# Patient Record
Sex: Female | Born: 1940 | Race: White | Hispanic: No | Marital: Married | Smoking: Former smoker
Health system: Southern US, Community
[De-identification: ages and names within clinical notes are randomized; demographics above are authoritative.]

## PROBLEM LIST (undated history)

## (undated) DIAGNOSIS — K649 Unspecified hemorrhoids: Secondary | ICD-10-CM

## (undated) DIAGNOSIS — I700 Atherosclerosis of aorta: Secondary | ICD-10-CM

## (undated) DIAGNOSIS — D649 Anemia, unspecified: Secondary | ICD-10-CM

## (undated) DIAGNOSIS — I2510 Atherosclerotic heart disease of native coronary artery without angina pectoris: Secondary | ICD-10-CM

## (undated) DIAGNOSIS — G43909 Migraine, unspecified, not intractable, without status migrainosus: Secondary | ICD-10-CM

## (undated) DIAGNOSIS — M1990 Unspecified osteoarthritis, unspecified site: Secondary | ICD-10-CM

## (undated) DIAGNOSIS — I10 Essential (primary) hypertension: Secondary | ICD-10-CM

## (undated) DIAGNOSIS — K219 Gastro-esophageal reflux disease without esophagitis: Secondary | ICD-10-CM

## (undated) DIAGNOSIS — R519 Headache, unspecified: Secondary | ICD-10-CM

## (undated) DIAGNOSIS — R51 Headache: Secondary | ICD-10-CM

## (undated) DIAGNOSIS — D369 Benign neoplasm, unspecified site: Secondary | ICD-10-CM

## (undated) DIAGNOSIS — C801 Malignant (primary) neoplasm, unspecified: Secondary | ICD-10-CM

## (undated) DIAGNOSIS — E039 Hypothyroidism, unspecified: Secondary | ICD-10-CM

## (undated) DIAGNOSIS — E785 Hyperlipidemia, unspecified: Secondary | ICD-10-CM

## (undated) DIAGNOSIS — I38 Endocarditis, valve unspecified: Secondary | ICD-10-CM

## (undated) DIAGNOSIS — R0600 Dyspnea, unspecified: Secondary | ICD-10-CM

## (undated) DIAGNOSIS — J40 Bronchitis, not specified as acute or chronic: Secondary | ICD-10-CM

## (undated) DIAGNOSIS — G5600 Carpal tunnel syndrome, unspecified upper limb: Secondary | ICD-10-CM

## (undated) DIAGNOSIS — C44310 Basal cell carcinoma of skin of unspecified parts of face: Secondary | ICD-10-CM

## (undated) DIAGNOSIS — K08109 Complete loss of teeth, unspecified cause, unspecified class: Secondary | ICD-10-CM

## (undated) DIAGNOSIS — K5790 Diverticulosis of intestine, part unspecified, without perforation or abscess without bleeding: Secondary | ICD-10-CM

## (undated) DIAGNOSIS — J4520 Mild intermittent asthma, uncomplicated: Secondary | ICD-10-CM

## (undated) DIAGNOSIS — M48062 Spinal stenosis, lumbar region with neurogenic claudication: Secondary | ICD-10-CM

## (undated) DIAGNOSIS — Z86010 Personal history of colonic polyps: Secondary | ICD-10-CM

## (undated) DIAGNOSIS — J45909 Unspecified asthma, uncomplicated: Secondary | ICD-10-CM

## (undated) DIAGNOSIS — J189 Pneumonia, unspecified organism: Secondary | ICD-10-CM

## (undated) DIAGNOSIS — R011 Cardiac murmur, unspecified: Secondary | ICD-10-CM

## (undated) HISTORY — PX: TRIGGER FINGER RELEASE: SHX641

## (undated) HISTORY — PX: JOINT REPLACEMENT: SHX530

## (undated) HISTORY — PX: ABDOMINAL HYSTERECTOMY: SHX81

## (undated) HISTORY — PX: CARDIAC CATHETERIZATION: SHX172

## (undated) HISTORY — PX: APPENDECTOMY: SHX54

## (undated) HISTORY — PX: OTHER SURGICAL HISTORY: SHX169

## (undated) HISTORY — PX: DILATION AND CURETTAGE OF UTERUS: SHX78

---
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
## 1984-01-02 HISTORY — PX: VAGINAL HYSTERECTOMY: SUR661

## 1999-03-14 ENCOUNTER — Encounter: Payer: Self-pay | Admitting: Emergency Medicine

## 1999-03-14 ENCOUNTER — Inpatient Hospital Stay (HOSPITAL_COMMUNITY): Admission: EM | Admit: 1999-03-14 | Discharge: 1999-03-17 | Payer: Self-pay | Admitting: Emergency Medicine

## 1999-11-24 HISTORY — PX: ESOPHAGOGASTRODUODENOSCOPY: SHX1529

## 2004-09-25 ENCOUNTER — Ambulatory Visit: Payer: Self-pay | Admitting: Internal Medicine

## 2005-06-21 ENCOUNTER — Ambulatory Visit: Payer: Self-pay | Admitting: Orthopedic Surgery

## 2005-10-02 ENCOUNTER — Ambulatory Visit: Payer: Self-pay | Admitting: Internal Medicine

## 2006-09-10 ENCOUNTER — Other Ambulatory Visit: Payer: Self-pay

## 2006-09-10 ENCOUNTER — Ambulatory Visit: Payer: Self-pay | Admitting: Orthopedic Surgery

## 2006-09-24 ENCOUNTER — Ambulatory Visit: Payer: Self-pay | Admitting: Orthopedic Surgery

## 2006-09-24 ENCOUNTER — Other Ambulatory Visit: Payer: Self-pay

## 2006-10-01 ENCOUNTER — Ambulatory Visit: Payer: Self-pay | Admitting: Orthopedic Surgery

## 2006-10-08 ENCOUNTER — Ambulatory Visit: Payer: Self-pay | Admitting: Internal Medicine

## 2006-10-09 ENCOUNTER — Ambulatory Visit: Payer: Self-pay | Admitting: Internal Medicine

## 2007-10-13 ENCOUNTER — Ambulatory Visit: Payer: Self-pay | Admitting: Internal Medicine

## 2008-01-29 ENCOUNTER — Emergency Department: Payer: Self-pay | Admitting: Emergency Medicine

## 2008-02-03 ENCOUNTER — Ambulatory Visit: Payer: Self-pay | Admitting: Specialist

## 2008-07-20 ENCOUNTER — Ambulatory Visit: Payer: Self-pay | Admitting: Specialist

## 2008-08-30 ENCOUNTER — Ambulatory Visit: Payer: Self-pay | Admitting: Specialist

## 2008-10-18 ENCOUNTER — Ambulatory Visit: Payer: Self-pay | Admitting: Internal Medicine

## 2009-01-01 HISTORY — PX: CHOLECYSTECTOMY: SHX55

## 2009-01-01 HISTORY — PX: HEMORRHOID BANDING: SHX5850

## 2009-02-14 ENCOUNTER — Ambulatory Visit: Payer: Self-pay | Admitting: Unknown Physician Specialty

## 2009-02-23 ENCOUNTER — Ambulatory Visit: Payer: Self-pay | Admitting: Unknown Physician Specialty

## 2009-02-23 HISTORY — PX: COLONOSCOPY: SHX174

## 2009-02-23 HISTORY — PX: ESOPHAGOGASTRODUODENOSCOPY: SHX1529

## 2009-03-08 ENCOUNTER — Ambulatory Visit: Payer: Self-pay | Admitting: Surgery

## 2009-03-15 ENCOUNTER — Ambulatory Visit: Payer: Self-pay | Admitting: Surgery

## 2009-07-20 ENCOUNTER — Ambulatory Visit: Payer: Self-pay | Admitting: Specialist

## 2009-08-10 ENCOUNTER — Inpatient Hospital Stay: Payer: Self-pay | Admitting: Specialist

## 2009-08-31 ENCOUNTER — Encounter: Payer: Self-pay | Admitting: Specialist

## 2009-09-01 ENCOUNTER — Encounter: Payer: Self-pay | Admitting: Specialist

## 2009-10-01 ENCOUNTER — Encounter: Payer: Self-pay | Admitting: Specialist

## 2009-10-20 ENCOUNTER — Ambulatory Visit: Payer: Self-pay | Admitting: Internal Medicine

## 2009-11-01 ENCOUNTER — Encounter: Payer: Self-pay | Admitting: Specialist

## 2009-12-01 ENCOUNTER — Encounter: Payer: Self-pay | Admitting: Specialist

## 2010-10-23 ENCOUNTER — Ambulatory Visit: Payer: Self-pay | Admitting: Internal Medicine

## 2011-10-26 ENCOUNTER — Ambulatory Visit: Payer: Self-pay

## 2012-01-02 HISTORY — PX: KNEE ARTHROSCOPY: SUR90

## 2012-03-31 HISTORY — PX: ESOPHAGOGASTRODUODENOSCOPY: SHX1529

## 2014-07-30 ENCOUNTER — Encounter
Admission: RE | Admit: 2014-07-30 | Discharge: 2014-07-30 | Disposition: A | Discharge status: Home / Self Care | Payer: Medicare Other | Source: Ambulatory Visit | Attending: Ophthalmology | Admitting: Ophthalmology

## 2014-07-30 HISTORY — DX: Essential (primary) hypertension: I10

## 2014-07-30 HISTORY — DX: Unspecified asthma, uncomplicated: J45.909

## 2014-07-30 HISTORY — DX: Diverticulosis of intestine, part unspecified, without perforation or abscess without bleeding: K57.90

## 2014-07-30 HISTORY — DX: Pneumonia, unspecified organism: J18.9

## 2014-07-30 HISTORY — DX: Carpal tunnel syndrome, unspecified upper limb: G56.00

## 2014-07-30 HISTORY — DX: Bronchitis, not specified as acute or chronic: J40

## 2014-07-30 HISTORY — DX: Gastro-esophageal reflux disease without esophagitis: K21.9

## 2014-07-30 HISTORY — DX: Unspecified osteoarthritis, unspecified site: M19.90

## 2014-08-02 ENCOUNTER — Ambulatory Visit: Payer: Medicare Other | Admitting: Anesthesiology

## 2014-08-02 ENCOUNTER — Encounter
Admission: RE | Disposition: A | Discharge status: Home / Self Care | Payer: Self-pay | Source: Ambulatory Visit | Attending: Ophthalmology

## 2014-08-02 ENCOUNTER — Ambulatory Visit
Admission: RE | Admit: 2014-08-02 | Discharge: 2014-08-02 | Disposition: A | Discharge status: Home / Self Care | Payer: Medicare Other | Source: Ambulatory Visit | Attending: Ophthalmology | Admitting: Ophthalmology

## 2014-08-02 ENCOUNTER — Encounter: Payer: Self-pay | Admitting: *Deleted

## 2014-08-02 DIAGNOSIS — Z96651 Presence of right artificial knee joint: Secondary | ICD-10-CM | POA: Insufficient documentation

## 2014-08-02 DIAGNOSIS — Z974 Presence of external hearing-aid: Secondary | ICD-10-CM | POA: Diagnosis not present

## 2014-08-02 DIAGNOSIS — Z79899 Other long term (current) drug therapy: Secondary | ICD-10-CM | POA: Insufficient documentation

## 2014-08-02 DIAGNOSIS — H269 Unspecified cataract: Secondary | ICD-10-CM | POA: Diagnosis present

## 2014-08-02 DIAGNOSIS — H9190 Unspecified hearing loss, unspecified ear: Secondary | ICD-10-CM | POA: Diagnosis not present

## 2014-08-02 DIAGNOSIS — E079 Disorder of thyroid, unspecified: Secondary | ICD-10-CM | POA: Insufficient documentation

## 2014-08-02 DIAGNOSIS — Z791 Long term (current) use of non-steroidal anti-inflammatories (NSAID): Secondary | ICD-10-CM | POA: Insufficient documentation

## 2014-08-02 DIAGNOSIS — I10 Essential (primary) hypertension: Secondary | ICD-10-CM | POA: Insufficient documentation

## 2014-08-02 DIAGNOSIS — Z888 Allergy status to other drugs, medicaments and biological substances status: Secondary | ICD-10-CM | POA: Diagnosis not present

## 2014-08-02 DIAGNOSIS — Z9889 Other specified postprocedural states: Secondary | ICD-10-CM | POA: Diagnosis not present

## 2014-08-02 DIAGNOSIS — K219 Gastro-esophageal reflux disease without esophagitis: Secondary | ICD-10-CM | POA: Insufficient documentation

## 2014-08-02 DIAGNOSIS — H2511 Age-related nuclear cataract, right eye: Secondary | ICD-10-CM | POA: Diagnosis not present

## 2014-08-02 DIAGNOSIS — J45909 Unspecified asthma, uncomplicated: Secondary | ICD-10-CM | POA: Diagnosis not present

## 2014-08-02 DIAGNOSIS — Z885 Allergy status to narcotic agent status: Secondary | ICD-10-CM | POA: Insufficient documentation

## 2014-08-02 DIAGNOSIS — M1990 Unspecified osteoarthritis, unspecified site: Secondary | ICD-10-CM | POA: Diagnosis not present

## 2014-08-02 DIAGNOSIS — Z881 Allergy status to other antibiotic agents status: Secondary | ICD-10-CM | POA: Insufficient documentation

## 2014-08-02 HISTORY — PX: CATARACT EXTRACTION W/PHACO: SHX586

## 2014-08-02 SURGERY — PHACOEMULSIFICATION, CATARACT, WITH IOL INSERTION
Anesthesia: General | Site: Eye | Laterality: Right | Wound class: Clean

## 2014-08-02 MED ORDER — MOXIFLOXACIN HCL 0.5 % OP SOLN
1.0000 [drp] | OPHTHALMIC | Status: AC
  Administered 2014-08-02 (×3): 1 [drp] via OPHTHALMIC

## 2014-08-02 MED ORDER — HYALURONIDASE HUMAN 150 UNIT/ML IJ SOLN
INTRAMUSCULAR | Status: AC
  Filled 2014-08-02: qty 1

## 2014-08-02 MED ORDER — CEFUROXIME OPHTHALMIC INJECTION 1 MG/0.1 ML
INJECTION | OPHTHALMIC | Status: AC
Start: 2014-08-02 — End: 2014-08-02
  Filled 2014-08-02: qty 0.1

## 2014-08-02 MED ORDER — CYCLOPENTOLATE HCL 2 % OP SOLN
1.0000 [drp] | OPHTHALMIC | Status: AC
  Administered 2014-08-02 (×4): 1 [drp] via OPHTHALMIC

## 2014-08-02 MED ORDER — MOXIFLOXACIN HCL 0.5 % OP SOLN
OPHTHALMIC | Status: DC | PRN
  Administered 2014-08-02: 2 [drp] via OPHTHALMIC

## 2014-08-02 MED ORDER — CYCLOPENTOLATE HCL 2 % OP SOLN
OPHTHALMIC | Status: AC
  Administered 2014-08-02: 1 [drp] via OPHTHALMIC
  Filled 2014-08-02: qty 2

## 2014-08-02 MED ORDER — SODIUM CHLORIDE 0.9 % IV SOLN
INTRAVENOUS | Status: DC
  Administered 2014-08-02: 11:00:00 via INTRAVENOUS

## 2014-08-02 MED ORDER — EPINEPHRINE HCL 1 MG/ML IJ SOLN
INTRAOCULAR | Status: DC | PRN
  Administered 2014-08-02: 200 mL

## 2014-08-02 MED ORDER — NA CHONDROIT SULF-NA HYALURON 40-17 MG/ML IO SOLN
INTRAOCULAR | Status: DC | PRN
  Administered 2014-08-02: 2 mL via INTRAOCULAR
  Administered 2014-08-02: 1 mL via INTRAOCULAR

## 2014-08-02 MED ORDER — BUPIVACAINE HCL (PF) 0.75 % IJ SOLN
INTRAMUSCULAR | Status: AC
  Filled 2014-08-02: qty 10

## 2014-08-02 MED ORDER — CARBACHOL 0.01 % IO SOLN
INTRAOCULAR | Status: DC | PRN
  Administered 2014-08-02: 0.5 mL via INTRAOCULAR

## 2014-08-02 MED ORDER — PHENYLEPHRINE HCL 10 % OP SOLN
1.0000 [drp] | OPHTHALMIC | Status: AC
  Administered 2014-08-02 (×4): 1 [drp] via OPHTHALMIC

## 2014-08-02 MED ORDER — CEFUROXIME OPHTHALMIC INJECTION 1 MG/0.1 ML
INJECTION | OPHTHALMIC | Status: DC | PRN
  Administered 2014-08-02: 0.1 mL via INTRACAMERAL

## 2014-08-02 MED ORDER — MOXIFLOXACIN HCL 0.5 % OP SOLN
OPHTHALMIC | Status: AC
  Administered 2014-08-02: 1 [drp] via OPHTHALMIC
  Filled 2014-08-02: qty 3

## 2014-08-02 MED ORDER — TETRACAINE HCL 0.5 % OP SOLN
OPHTHALMIC | Status: DC | PRN
Start: 2014-08-02 — End: 2014-08-02
  Administered 2014-08-02: 2 [drp] via OPHTHALMIC

## 2014-08-02 MED ORDER — LIDOCAINE HCL (PF) 4 % IJ SOLN
INTRAMUSCULAR | Status: AC
  Filled 2014-08-02: qty 5

## 2014-08-02 MED ORDER — PHENYLEPHRINE HCL 10 % OP SOLN
OPHTHALMIC | Status: AC
  Administered 2014-08-02: 1 [drp] via OPHTHALMIC
  Filled 2014-08-02: qty 5

## 2014-08-02 MED ORDER — EPINEPHRINE HCL 1 MG/ML IJ SOLN
INTRAMUSCULAR | Status: AC
  Filled 2014-08-02: qty 1

## 2014-08-02 MED ORDER — ALFENTANIL 500 MCG/ML IJ INJ
INJECTION | INTRAMUSCULAR | Status: DC | PRN
  Administered 2014-08-02: 500 ug via INTRAVENOUS

## 2014-08-02 MED ORDER — TETRACAINE HCL 0.5 % OP SOLN
OPHTHALMIC | Status: AC
  Filled 2014-08-02: qty 2

## 2014-08-02 MED ORDER — LIDOCAINE HCL (PF) 1 % IJ SOLN
INTRAOCULAR | Status: DC | PRN
  Administered 2014-08-02: 4 mL via OPHTHALMIC

## 2014-08-02 MED ORDER — NA CHONDROIT SULF-NA HYALURON 40-17 MG/ML IO SOLN
INTRAOCULAR | Status: AC
  Filled 2014-08-02: qty 1

## 2014-08-02 MED ORDER — LIDOCAINE HCL (PF) 4 % IJ SOLN
INTRAMUSCULAR | Status: DC | PRN
  Administered 2014-08-02: 7 mL via OPHTHALMIC

## 2014-08-02 SURGICAL SUPPLY — 32 items
CANNULA ANT/CHMB 27G (MISCELLANEOUS) ×1 IMPLANT
CANNULA ANT/CHMB 27GA (MISCELLANEOUS) ×3 IMPLANT
CORD BIP STRL DISP 12FT (MISCELLANEOUS) ×3 IMPLANT
CUP MEDICINE 2OZ PLAST GRAD ST (MISCELLANEOUS) ×3 IMPLANT
DRAPE XRAY CASSETTE 23X24 (DRAPES) ×3 IMPLANT
ERASER HMR WETFIELD 18G (MISCELLANEOUS) ×3 IMPLANT
GLOVE BIO SURGEON STRL SZ8 (GLOVE) ×3 IMPLANT
GLOVE SURG LX 6.5 MICRO (GLOVE) ×2
GLOVE SURG LX 8.0 MICRO (GLOVE) ×2
GLOVE SURG LX STRL 6.5 MICRO (GLOVE) ×1 IMPLANT
GLOVE SURG LX STRL 8.0 MICRO (GLOVE) ×1 IMPLANT
GOWN STRL REUS W/ TWL LRG LVL3 (GOWN DISPOSABLE) ×1 IMPLANT
GOWN STRL REUS W/ TWL XL LVL3 (GOWN DISPOSABLE) ×1 IMPLANT
GOWN STRL REUS W/TWL LRG LVL3 (GOWN DISPOSABLE) ×3
GOWN STRL REUS W/TWL XL LVL3 (GOWN DISPOSABLE) ×3
KIT IRRIGAT 0.9 MICROSMOOTH (MISCELLANEOUS) ×3 IMPLANT
LENS IOL ACRSF IQ ULTRA 16.0 (Intraocular Lens) IMPLANT
LENS IOL ACRYSOF IQ 16.0 (Intraocular Lens) ×3 IMPLANT
PACK CATARACT (MISCELLANEOUS) ×3 IMPLANT
PACK CATARACT DINGLEDEIN LX (MISCELLANEOUS) ×3 IMPLANT
PACK EYE AFTER SURG (MISCELLANEOUS) ×3 IMPLANT
SHLD EYE VISITEC  UNIV (MISCELLANEOUS) ×3 IMPLANT
SOL BSS BAG (MISCELLANEOUS) ×3
SOL PREP PVP 2OZ (MISCELLANEOUS) ×3
SOLUTION BSS BAG (MISCELLANEOUS) ×1 IMPLANT
SOLUTION PREP PVP 2OZ (MISCELLANEOUS) ×1 IMPLANT
SUT SILK 5-0 (SUTURE) ×3 IMPLANT
SYR 3ML LL SCALE MARK (SYRINGE) ×3 IMPLANT
SYR 5ML LL (SYRINGE) ×3 IMPLANT
SYR TB 1ML 27GX1/2 LL (SYRINGE) ×3 IMPLANT
WATER STERILE IRR 1000ML POUR (IV SOLUTION) ×3 IMPLANT
WIPE NON LINTING 3.25X3.25 (MISCELLANEOUS) ×3 IMPLANT

## 2014-08-02 NOTE — Anesthesia Preprocedure Evaluation (Signed)
Anesthesia Evaluation  Patient identified by MRN, date of birth, ID band Patient awake    Reviewed: Allergy & Precautions, H&P , NPO status , Patient's Chart, lab work & pertinent test results, reviewed documented beta blocker date and time   History of Anesthesia Complications Negative for: history of anesthetic complications  Airway Mallampati: I  TM Distance: >3 FB Neck ROM: full    Dental no notable dental hx. (+) Partial Lower, Edentulous Upper, Upper Dentures   Pulmonary neg shortness of breath, asthma , neg sleep apnea, neg COPDneg recent URI,  breath sounds clear to auscultation  Pulmonary exam normal - wheezing      Cardiovascular Exercise Tolerance: Good hypertension, - angina- CAD, - Past MI and - CABG Rhythm:regular Rate:Normal     Neuro/Psych  Neuromuscular disease (right sided) negative psych ROS   GI/Hepatic Neg liver ROS, GERD-  Controlled,  Endo/Other  neg diabetesHypothyroidism   Renal/GU negative Renal ROS  negative genitourinary   Musculoskeletal   Abdominal   Peds  Hematology negative hematology ROS (+)   Anesthesia Other Findings Past Medical History:   Asthma                                                       Pneumonia                                                      Comment:in the past   Bronchitis                                                   Hypertension                                                 GERD (gastroesophageal reflux disease)                       Diverticulosis                                               Arthritis                                                    Carpal tunnel syndrome                                       Reproductive/Obstetrics negative OB ROS  Anesthesia Physical Anesthesia Plan  ASA: II  Anesthesia Plan: General   Post-op Pain Management:    Induction:   Airway Management  Planned:   Additional Equipment:   Intra-op Plan:   Post-operative Plan:   Informed Consent: I have reviewed the patients History and Physical, chart, labs and discussed the procedure including the risks, benefits and alternatives for the proposed anesthesia with the patient or authorized representative who has indicated his/her understanding and acceptance.   Dental Advisory Given  Plan Discussed with: CRNA and Anesthesiologist  Anesthesia Plan Comments:         Anesthesia Quick Evaluation

## 2014-08-02 NOTE — Transfer of Care (Signed)
Immediate Anesthesia Transfer of Care Note  Patient: Nichole Sanchez  Procedure(s) Performed: Procedure(s) with comments: CATARACT EXTRACTION PHACO AND INTRAOCULAR LENS PLACEMENT (IOC) (Right) - [place]:  01:30.3 AP%: 16.4 CDE: 28.22  Fluid Lot# [id number]  Patient Location: PACU and Short Stay  Anesthesia Type:MAC  Level of Consciousness: awake and oriented  Airway & Oxygen Therapy: Patient Spontanous Breathing  Post-op Assessment: Report given to RN  Post vital signs: Reviewed and stable  Last Vitals:  Filed Vitals:   08/02/14 1125  BP: 167/89  Pulse: 73  Temp: 36.9 C  Resp: 16    Complications: No apparent anesthesia complications

## 2014-08-02 NOTE — H&P (Signed)
See scanned H&P

## 2014-08-02 NOTE — Discharge Instructions (Addendum)
See handoutAMBULATORY SURGERY  °DISCHARGE INSTRUCTIONS ° ° °1) The drugs that you were given will stay in your system until tomorrow so for the next 24 hours you should not: ° °A) Drive an automobile °B) Make any legal decisions °C) Drink any alcoholic beverage ° ° °2) You may resume regular meals tomorrow.  Today it is better to start with liquids and gradually work up to solid foods. ° °You may eat anything you prefer, but it is better to start with liquids, then soup and crackers, and gradually work up to solid foods. ° ° °3) Please notify your doctor immediately if you have any unusual bleeding, trouble breathing, redness and pain at the surgery site, drainage, fever, or pain not relieved by medication. ° ° ° °4) Additional Instructions: ° ° ° ° ° ° ° °Please contact your physician with any problems or Same Day Surgery at [phone], Monday through Friday 6 am to 4 pm, or [facility] Main number at [phone].AMBULATORY SURGERY  °DISCHARGE INSTRUCTIONS ° ° °5) The drugs that you were given will stay in your system until tomorrow so for the next 24 hours you should not: ° °D) Drive an automobile °E) Make any legal decisions °F) Drink any alcoholic beverage ° ° °6) You may resume regular meals tomorrow.  Today it is better to start with liquids and gradually work up to solid foods. ° °You may eat anything you prefer, but it is better to start with liquids, then soup and crackers, and gradually work up to solid foods. ° ° °7) Please notify your doctor immediately if you have any unusual bleeding, trouble breathing, redness and pain at the surgery site, drainage, fever, or pain not relieved by medication. ° ° ° °8) Additional Instructions: ° ° ° ° ° ° ° °Please contact your physician with any problems or Same Day Surgery at [phone], Monday through Friday 6 am to 4 pm, or [facility] [place] Main number at [phone]. °

## 2014-08-02 NOTE — Anesthesia Postprocedure Evaluation (Signed)
  Anesthesia Post-op Note  Patient: Nichole Sanchez  Procedure(s) Performed: Procedure(s) with comments: CATARACT EXTRACTION PHACO AND INTRAOCULAR LENS PLACEMENT ([facility]) (Right) - [place]:  01:30.3 AP%: 16.4 CDE: 28.22  Fluid Lot# [id number]  Anesthesia type:General  Patient location: PACU  Post pain: Pain level controlled  Post assessment: Post-op Vital signs reviewed, Patient's Cardiovascular Status Stable, Respiratory Function Stable, Patent Airway and No signs of Nausea or vomiting  Post vital signs: Reviewed and stable  Last Vitals:  Filed Vitals:   08/02/14 1125  BP: 167/89  Pulse: 73  Temp: 36.9 C  Resp: 16    Level of consciousness: awake, alert  and patient cooperative  Complications: No apparent anesthesia complications

## 2014-08-02 NOTE — Op Note (Signed)
Date of Surgery: 08/02/2014 Date of Dictation: 08/02/2014 11:23 AM Pre-operative Diagnosis:  Nuclear Sclerotic Cataract right Eye Post-operative Diagnosis: same Procedure performed: Extra-capsular Cataract Extraction (ECCE) with placement of a posterior chamber intraocular lens (IOL) right Eye IOL:  Implant Name Type Inv. Item Serial No. Manufacturer Lot No. LRB No. Used  LENS IOL ACRYSOF IQ 16.0 - [id number] Intraocular Lens LENS IOL ACRYSOF IQ 16.0 [id number] ALCON   Right 1   Anesthesia: 2% Lidocaine and 4% Marcaine in a 50/50 mixture with 10 unites/ml of Hylenex given as a peribulbar Anesthesiologist: Anesthesiologist: Martha Clan, MD CRNA: Courtney Paris, CRNA Complications: none Estimated Blood Loss: less than 1 ml  Description of procedure:  The patient was given anesthesia and sedation via intravenous access. The patient was then prepped and draped in the usual fashion. A 25-gauge needle was bent for initiating the capsulorhexis. A 5-0 silk suture was placed through the conjunctiva superior and inferiorly to serve as bridle sutures. Hemostasis was obtained at the superior limbus using an eraser cautery. A partial thickness groove was made at the anterior surgical limbus with a 64 Beaver blade and this was dissected anteriorly with an Avaya. The anterior chamber was entered at 10 o'clock with a 1.0 mm paracentesis knife and through the lamellar dissection with a 2.6 mm Alcon keratome. Epi-Shugarcaine 0.5 CC [9 cc BSS Plus (Alcon), 3 cc 4% preservative-free lidocaine (Hospira) and 4 cc 1:1000 preservative-free, bisulfite-free epinephrine] was injected via the paracentesis tract. DiscoVisc was injected to replace the aqueous and a continuous tear curvilinear capsulorhexis was performed using a bent 25-gauge needle.  Balance salt on a syringe was used to perform hydro-dissection and phacoemulsification was carried out using a divide and conquer technique. Procedure(s) with  comments: CATARACT EXTRACTION PHACO AND INTRAOCULAR LENS PLACEMENT ([facility]) (Right) - [place]:  01:30.3 AP%: 16.4 CDE: 28.22  Fluid Lot# [id number] H. Irrigation/aspiration was used to remove the residual cortex and the capsular bag was inflated with DiscoVisc. The intraocular lens was inserted into the capsular bag using a pre-loaded UltraSert Delivery System. Irrigation/aspiration was used to remove the residual DiscoVisc. The wound was inflated with balanced salt and checked for leaks. None were found. Miostat was injected via the paracentesis track and 0.1 ml of cefuroxime containing 1 mg of drug  was injected via the paracentesis track. The wound was checked for leaks again and none were found.   The bridal sutures were removed and two drops of Vigamox were placed on the eye. An eye shield was placed to protect the eye and the patient was discharged to the recovery area in good condition.   Shepherd Finnan MD

## 2014-08-02 NOTE — Interval H&P Note (Signed)
History and Physical Interval Note:  08/02/2014 10:30 AM  Nichole Sanchez  has presented today for surgery, with the diagnosis of cataract  The various methods of treatment have been discussed with the patient and family. After consideration of risks, benefits and other options for treatment, the patient has consented to  Procedure(s): CATARACT EXTRACTION PHACO AND INTRAOCULAR LENS PLACEMENT ([place]) (Right) as a surgical intervention .  The patient's history has been reviewed, patient examined, no change in status, stable for surgery.  I have reviewed the patient's chart and labs.  Questions were answered to the patient's satisfaction.     Kipp Shank

## 2014-08-17 ENCOUNTER — Encounter: Payer: Self-pay | Admitting: *Deleted

## 2014-08-23 ENCOUNTER — Encounter
Admission: RE | Disposition: A | Discharge status: Home / Self Care | Payer: Self-pay | Source: Ambulatory Visit | Attending: Ophthalmology

## 2014-08-23 ENCOUNTER — Ambulatory Visit: Payer: Medicare Other | Admitting: Anesthesiology

## 2014-08-23 ENCOUNTER — Encounter: Payer: Self-pay | Admitting: *Deleted

## 2014-08-23 ENCOUNTER — Ambulatory Visit
Admission: RE | Admit: 2014-08-23 | Discharge: 2014-08-23 | Disposition: A | Discharge status: Home / Self Care | Payer: Medicare Other | Source: Ambulatory Visit | Attending: Ophthalmology | Admitting: Ophthalmology

## 2014-08-23 DIAGNOSIS — Z885 Allergy status to narcotic agent status: Secondary | ICD-10-CM | POA: Insufficient documentation

## 2014-08-23 DIAGNOSIS — H2512 Age-related nuclear cataract, left eye: Secondary | ICD-10-CM | POA: Diagnosis not present

## 2014-08-23 DIAGNOSIS — J45909 Unspecified asthma, uncomplicated: Secondary | ICD-10-CM | POA: Insufficient documentation

## 2014-08-23 DIAGNOSIS — I10 Essential (primary) hypertension: Secondary | ICD-10-CM | POA: Diagnosis not present

## 2014-08-23 DIAGNOSIS — Z96659 Presence of unspecified artificial knee joint: Secondary | ICD-10-CM | POA: Insufficient documentation

## 2014-08-23 DIAGNOSIS — Z79899 Other long term (current) drug therapy: Secondary | ICD-10-CM | POA: Diagnosis not present

## 2014-08-23 DIAGNOSIS — H9190 Unspecified hearing loss, unspecified ear: Secondary | ICD-10-CM | POA: Diagnosis not present

## 2014-08-23 DIAGNOSIS — Z89012 Acquired absence of left thumb: Secondary | ICD-10-CM | POA: Diagnosis not present

## 2014-08-23 DIAGNOSIS — K219 Gastro-esophageal reflux disease without esophagitis: Secondary | ICD-10-CM | POA: Diagnosis not present

## 2014-08-23 DIAGNOSIS — Z791 Long term (current) use of non-steroidal anti-inflammatories (NSAID): Secondary | ICD-10-CM | POA: Insufficient documentation

## 2014-08-23 DIAGNOSIS — E079 Disorder of thyroid, unspecified: Secondary | ICD-10-CM | POA: Insufficient documentation

## 2014-08-23 DIAGNOSIS — M1990 Unspecified osteoarthritis, unspecified site: Secondary | ICD-10-CM | POA: Insufficient documentation

## 2014-08-23 DIAGNOSIS — Z974 Presence of external hearing-aid: Secondary | ICD-10-CM | POA: Insufficient documentation

## 2014-08-23 DIAGNOSIS — Z9849 Cataract extraction status, unspecified eye: Secondary | ICD-10-CM | POA: Insufficient documentation

## 2014-08-23 DIAGNOSIS — Z888 Allergy status to other drugs, medicaments and biological substances status: Secondary | ICD-10-CM | POA: Insufficient documentation

## 2014-08-23 DIAGNOSIS — Z90710 Acquired absence of both cervix and uterus: Secondary | ICD-10-CM | POA: Insufficient documentation

## 2014-08-23 DIAGNOSIS — Z9889 Other specified postprocedural states: Secondary | ICD-10-CM | POA: Diagnosis not present

## 2014-08-23 HISTORY — DX: Hypothyroidism, unspecified: E03.9

## 2014-08-23 HISTORY — PX: CATARACT EXTRACTION W/PHACO: SHX586

## 2014-08-23 SURGERY — PHACOEMULSIFICATION, CATARACT, WITH IOL INSERTION
Anesthesia: Monitor Anesthesia Care | Site: Eye | Laterality: Left | Wound class: Clean

## 2014-08-23 MED ORDER — CEFUROXIME OPHTHALMIC INJECTION 1 MG/0.1 ML
INJECTION | OPHTHALMIC | Status: DC | PRN
  Administered 2014-08-23: 1 mg via INTRACAMERAL

## 2014-08-23 MED ORDER — LIDOCAINE HCL (PF) 4 % IJ SOLN
INTRAMUSCULAR | Status: DC | PRN
  Administered 2014-08-23: 4 mL via OPHTHALMIC

## 2014-08-23 MED ORDER — MIDAZOLAM HCL 5 MG/5ML IJ SOLN
INTRAMUSCULAR | Status: DC | PRN
  Administered 2014-08-23: 2 mg via INTRAVENOUS

## 2014-08-23 MED ORDER — OXYCODONE HCL 5 MG/5ML PO SOLN: 5.0000 mg | Freq: Once | ORAL | Status: DC | PRN

## 2014-08-23 MED ORDER — LIDOCAINE HCL (PF) 1 % IJ SOLN
INTRAOCULAR | Status: DC | PRN
  Administered 2014-08-23: 4 mL via OPHTHALMIC

## 2014-08-23 MED ORDER — PHENYLEPHRINE HCL 10 % OP SOLN
OPHTHALMIC | Status: AC
  Administered 2014-08-23: 1 [drp] via OPHTHALMIC
  Filled 2014-08-23: qty 5

## 2014-08-23 MED ORDER — PHENYLEPHRINE HCL 10 % OP SOLN
1.0000 [drp] | OPHTHALMIC | Status: AC
  Administered 2014-08-23 (×4): 1 [drp] via OPHTHALMIC

## 2014-08-23 MED ORDER — MOXIFLOXACIN HCL 0.5 % OP SOLN
1.0000 [drp] | OPHTHALMIC | Status: AC
  Administered 2014-08-23 (×3): 1 [drp] via OPHTHALMIC

## 2014-08-23 MED ORDER — OXYCODONE HCL 5 MG PO TABS: 5.0000 mg | ORAL_TABLET | Freq: Once | ORAL | Status: DC | PRN

## 2014-08-23 MED ORDER — CYCLOPENTOLATE HCL 2 % OP SOLN
OPHTHALMIC | Status: AC
  Administered 2014-08-23: 1 [drp] via OPHTHALMIC
  Filled 2014-08-23: qty 2

## 2014-08-23 MED ORDER — SODIUM CHLORIDE 0.9 % IV SOLN
INTRAVENOUS | Status: DC
  Administered 2014-08-23: 11:00:00 via INTRAVENOUS

## 2014-08-23 MED ORDER — CYCLOPENTOLATE HCL 2 % OP SOLN
1.0000 [drp] | OPHTHALMIC | Status: AC
  Administered 2014-08-23 (×4): 1 [drp] via OPHTHALMIC

## 2014-08-23 MED ORDER — MOXIFLOXACIN HCL 0.5 % OP SOLN
OPHTHALMIC | Status: DC | PRN
  Administered 2014-08-23: 1 [drp] via OPHTHALMIC

## 2014-08-23 MED ORDER — NA CHONDROIT SULF-NA HYALURON 40-17 MG/ML IO SOLN
INTRAOCULAR | Status: DC | PRN
  Administered 2014-08-23: 1 mL via INTRAOCULAR

## 2014-08-23 MED ORDER — ALFENTANIL 500 MCG/ML IJ INJ
INJECTION | INTRAMUSCULAR | Status: DC | PRN
  Administered 2014-08-23: 500 ug via INTRAVENOUS

## 2014-08-23 MED ORDER — ONDANSETRON HCL 4 MG/2ML IJ SOLN: 4.0000 mg | Freq: Once | INTRAMUSCULAR | Status: DC | PRN

## 2014-08-23 MED ORDER — CARBACHOL 0.01 % IO SOLN
INTRAOCULAR | Status: DC | PRN
  Administered 2014-08-23: 0.5 mL via INTRAOCULAR

## 2014-08-23 MED ORDER — TETRACAINE HCL 0.5 % OP SOLN
OPHTHALMIC | Status: DC | PRN
  Administered 2014-08-23: 1 [drp] via OPHTHALMIC

## 2014-08-23 MED ORDER — EPINEPHRINE HCL 1 MG/ML IJ SOLN
INTRAOCULAR | Status: DC | PRN
  Administered 2014-08-23: 200 mL via OPHTHALMIC

## 2014-08-23 MED ORDER — MOXIFLOXACIN HCL 0.5 % OP SOLN
OPHTHALMIC | Status: AC
  Administered 2014-08-23: 1 [drp] via OPHTHALMIC
  Filled 2014-08-23: qty 3

## 2014-08-23 SURGICAL SUPPLY — 31 items
CANNULA ANT/CHMB 27G (MISCELLANEOUS) ×1 IMPLANT
CANNULA ANT/CHMB 27GA (MISCELLANEOUS) ×3 IMPLANT
CORD BIP STRL DISP 12FT (MISCELLANEOUS) ×3 IMPLANT
CUP MEDICINE 2OZ PLAST GRAD ST (MISCELLANEOUS) ×3 IMPLANT
DRAPE XRAY CASSETTE 23X24 (DRAPES) ×3 IMPLANT
ERASER HMR WETFIELD 18G (MISCELLANEOUS) ×3 IMPLANT
GLOVE BIO SURGEON STRL SZ8 (GLOVE) ×3 IMPLANT
GLOVE SURG LX 6.5 MICRO (GLOVE) ×2
GLOVE SURG LX 8.0 MICRO (GLOVE) ×2
GLOVE SURG LX STRL 6.5 MICRO (GLOVE) ×1 IMPLANT
GLOVE SURG LX STRL 8.0 MICRO (GLOVE) ×1 IMPLANT
GOWN STRL REUS W/ TWL LRG LVL3 (GOWN DISPOSABLE) ×1 IMPLANT
GOWN STRL REUS W/ TWL XL LVL3 (GOWN DISPOSABLE) ×1 IMPLANT
GOWN STRL REUS W/TWL LRG LVL3 (GOWN DISPOSABLE) ×3
GOWN STRL REUS W/TWL XL LVL3 (GOWN DISPOSABLE) ×3
LENS IOL ACRSF IQ ULTRA 17.0 (Intraocular Lens) IMPLANT
LENS IOL ACRYSOF IQ 17.0 (Intraocular Lens) ×3 IMPLANT
PACK CATARACT (MISCELLANEOUS) ×3 IMPLANT
PACK CATARACT DINGLEDEIN LX (MISCELLANEOUS) ×3 IMPLANT
PACK EYE AFTER SURG (MISCELLANEOUS) ×3 IMPLANT
SHLD EYE VISITEC  UNIV (MISCELLANEOUS) ×3 IMPLANT
SOL BSS BAG (MISCELLANEOUS) ×3
SOL PREP PVP 2OZ (MISCELLANEOUS) ×3
SOLUTION BSS BAG (MISCELLANEOUS) ×1 IMPLANT
SOLUTION PREP PVP 2OZ (MISCELLANEOUS) ×1 IMPLANT
SUT SILK 5-0 (SUTURE) ×3 IMPLANT
SYR 3ML LL SCALE MARK (SYRINGE) ×3 IMPLANT
SYR 5ML LL (SYRINGE) ×3 IMPLANT
SYR TB 1ML 27GX1/2 LL (SYRINGE) ×3 IMPLANT
WATER STERILE IRR 1000ML POUR (IV SOLUTION) ×3 IMPLANT
WIPE NON LINTING 3.25X3.25 (MISCELLANEOUS) ×3 IMPLANT

## 2014-08-23 NOTE — Anesthesia Preprocedure Evaluation (Signed)
Anesthesia Evaluation  Patient identified by MRN, date of birth, ID band Patient awake    Reviewed: Allergy & Precautions, NPO status , Patient's Chart, lab work & pertinent test results  History of Anesthesia Complications Negative for: history of anesthetic complications  Airway Mallampati: I  TM Distance: >3 FB Neck ROM: Full    Dental  (+) Upper Dentures, Partial Lower   Pulmonary asthma (no meds, enviromentally triggered) ,          Cardiovascular hypertension, Pt. on medications     Neuro/Psych  Neuromuscular disease (DTS)    GI/Hepatic GERD- (not taking meds any more, no wymptoms)  Medicated,  Endo/Other  neg diabetes (pt denies)Hypothyroidism   Renal/GU      Musculoskeletal   Abdominal   Peds  Hematology   Anesthesia Other Findings   Reproductive/Obstetrics                             Anesthesia Physical Anesthesia Plan  ASA: III  Anesthesia Plan: MAC   Post-op Pain Management:    Induction: Intravenous  Airway Management Planned:   Additional Equipment:   Intra-op Plan:   Post-operative Plan:   Informed Consent: I have reviewed the patients History and Physical, chart, labs and discussed the procedure including the risks, benefits and alternatives for the proposed anesthesia with the patient or authorized representative who has indicated his/her understanding and acceptance.     Plan Discussed with:   Anesthesia Plan Comments:         Anesthesia Quick Evaluation

## 2014-08-23 NOTE — Discharge Instructions (Addendum)
See handout. Eye Surgery Discharge Instructions  Expect mild scratchy sensation or mild soreness. DO NOT RUB YOUR EYE!  The day of surgery:  Minimal physical activity, but bed rest is not required  No reading, computer work, or close hand work  No bending, lifting, or straining.  May watch TV  For 24 hours:  No driving, legal decisions, or alcoholic beverages  Safety precautions  Eat anything you prefer: It is better to start with liquids, then soup then solid foods.  _____ Eye patch should be worn until postoperative exam tomorrow.  ____ Solar shield eyeglasses should be worn for comfort in the sunlight/patch while sleeping  Resume all regular medications including aspirin or Coumadin if these were discontinued prior to surgery. You may shower, bathe, shave, or wash your hair. Tylenol may be taken for mild discomfort.  Call your doctor if you experience significant pain, nausea, or vomiting, fever > 101 or other signs of infection. [phone] or [phone] Specific instructions:  Follow-up Information    Follow up with Estill Cotta, MD.   Specialty:  Ophthalmology   Why:  08/24/2014 at 10:50   Contact information:   [place] [address] [phone]      Eye Surgery Discharge Instructions  Expect mild scratchy sensation or mild soreness. DO NOT RUB YOUR EYE!  The day of surgery:  Minimal physical activity, but bed rest is not required  No reading, computer work, or close hand work  No bending, lifting, or straining.  May watch TV  For 24 hours:  No driving, legal decisions, or alcoholic beverages  Safety precautions  Eat anything you prefer: It is better to start with liquids, then soup then solid foods.  _____ Eye patch should be worn until postoperative exam tomorrow.  ____ Solar shield eyeglasses should be worn for comfort in the sunlight/patch while sleeping  Resume all regular medications including aspirin or  Coumadin if these were discontinued prior to surgery. You may shower, bathe, shave, or wash your hair. Tylenol may be taken for mild discomfort.  Call your doctor if you experience significant pain, nausea, or vomiting, fever > 101 or other signs of infection. [phone] or [phone] Specific instructions:  Follow-up Information    Follow up with Estill Cotta, MD.   Specialty:  Ophthalmology   Why:  08/24/2014 at 10:50   Contact information:   [address]   [place] [address] [phone]

## 2014-08-23 NOTE — Interval H&P Note (Signed)
History and Physical Interval Note:  08/23/2014 11:55 AM  [name]  has presented today for surgery, with the diagnosis of CATARACT  The various methods of treatment have been discussed with the patient and family. After consideration of risks, benefits and other options for treatment, the patient has consented to  Procedure(s): CATARACT EXTRACTION PHACO AND INTRAOCULAR LENS PLACEMENT ([place]) (Left) as a surgical intervention .  The patient's history has been reviewed, patient examined, no change in status, stable for surgery.  I have reviewed the patient's chart and labs.  Questions were answered to the patient's satisfaction.     Nichole Sanchez

## 2014-08-23 NOTE — Op Note (Signed)
Date of Surgery: 08/23/2014 Date of Dictation: 08/23/2014 12:39 PM Pre-operative Diagnosis:  Nuclear Sclerotic Cataract left Eye Post-operative Diagnosis: same Procedure performed: Extra-capsular Cataract Extraction (ECCE) with placement of a posterior chamber intraocular lens (IOL) left Eye IOL:  Implant Name Type Inv. Item Serial No. Manufacturer Lot No. LRB No. Used  LENS IOL ACRYSOF IQ 17.0 - [id number] Intraocular Lens LENS IOL ACRYSOF IQ 17.0 [id number] ALCON   Left 1   Anesthesia: 2% Lidocaine and 4% Marcaine in a 50/50 mixture with 10 unites/ml of Hylenex given as a peribulbar Anesthesiologist: Anesthesiologist: Gunnar Fusi, MD CRNA: Silvana Newness, CRNA Complications: none Estimated Blood Loss: less than 1 ml  Description of procedure:  The patient was given anesthesia and sedation via intravenous access. The patient was then prepped and draped in the usual fashion. A 25-gauge needle was bent for initiating the capsulorhexis. A 5-0 silk suture was placed through the conjunctiva superior and inferiorly to serve as bridle sutures. Hemostasis was obtained at the superior limbus using an eraser cautery. A partial thickness groove was made at the anterior surgical limbus with a 64 Beaver blade and this was dissected anteriorly with an Avaya. The anterior chamber was entered at 10 o'clock with a 1.0 mm paracentesis knife and through the lamellar dissection with a 2.6 mm Alcon keratome. Epi-Shugarcaine 0.5 CC [9 cc BSS Plus (Alcon), 3 cc 4% preservative-free lidocaine (Hospira) and 4 cc 1:1000 preservative-free, bisulfite-free epinephrine] was injected vai the paracentesis tract. DiscoVisc was injected to replace the aqueous and a continuous tear curvilinear capsulorhexis was performed using a bent 25-gauge needle.  Balance salt on a syringe was used to perform hydro-dissection and phacoemulsification was carried out using a divide and conquer technique. Procedure(s) with  comments: CATARACT EXTRACTION PHACO AND INTRAOCULAR LENS PLACEMENT (I[facility]) (Left) - [place] 01:11.4 AP% 23.0 CDE 28.17 FLUID LOT# [id number]. Irrigation/aspiration was used to remove the residual cortex and the capsular bag was inflated with DiscoVisc. The intraocular lens was inserted into the capsular bag using a pre-loaded UltraSert Delivery System. Irrigation/aspiration was used to remove the residual DiscoVisc. The wound was inflated with balanced salt and checked for leaks. None were found. Miostat was injected via the paracentesis track and 0.1 ml of cefuroxime containing 1 mg of drug  was injected via the paracentesis track. The wound was checked for leaks again and none were found.   The bridal sutures were removed and two drops of Vigamox were placed on the eye. An eye shield was placed to protect the eye and the patient was discharged to the recovery area in good condition.   Itzelle Gains MD

## 2014-08-23 NOTE — Anesthesia Postprocedure Evaluation (Signed)
  Anesthesia Post-op Note  Patient: Nichole Sanchez  Procedure(s) Performed: Procedure(s) with comments: CATARACT EXTRACTION PHACO AND INTRAOCULAR LENS PLACEMENT (I[facility]) (Left) - [place] 01:11.4 AP% 23.0 CDE 28.17 FLUID LOT# [id number]  Anesthesia type:MAC  Patient location: short stay  Post pain: Pain level controlled  Post assessment: Post-op Vital signs reviewed, Patient's Cardiovascular Status Stable, Respiratory Function Stable, Patent Airway and No signs of Nausea or vomiting  Post vital signs: Reviewed and stable  Last Vitals:  Filed Vitals:   08/23/14 1240  BP: 167/76  Pulse: 84  Temp: 36.4 C  Resp: 15    Level of consciousness: awake, alert  and patient cooperative  Complications: No apparent anesthesia complications

## 2014-08-23 NOTE — Transfer of Care (Signed)
Immediate Anesthesia Transfer of Care Note  Patient: ADDALEE KAVANAGH  Procedure(s) Performed: Procedure(s) with comments: CATARACT EXTRACTION PHACO AND INTRAOCULAR LENS PLACEMENT ([facility]) (Left) - [place] 01:11.4 AP% 23.0 CDE 28.17 FLUID LOT# [id number]  Patient Location: Short Stay  Anesthesia Type:MAC  Level of Consciousness: awake, alert , oriented and patient cooperative  Airway & Oxygen Therapy: Patient Spontanous Breathing  Post-op Assessment: Report given to RN, Post -op Vital signs reviewed and stable and Patient moving all extremities X 4  Post vital signs: Reviewed and stable  Last Vitals:  Filed Vitals:   08/23/14 1240  BP: 167/76  Pulse: 84  Temp: 36.4 C  Resp: 15    Complications: No apparent anesthesia complications

## 2014-08-23 NOTE — H&P (Signed)
See scanned H&P

## 2014-10-27 ENCOUNTER — Other Ambulatory Visit: Payer: Self-pay | Admitting: Internal Medicine

## 2014-10-27 DIAGNOSIS — Z1231 Encounter for screening mammogram for malignant neoplasm of breast: Secondary | ICD-10-CM

## 2014-11-09 ENCOUNTER — Ambulatory Visit
Admission: RE | Admit: 2014-11-09 | Discharge: 2014-11-09 | Disposition: A | Discharge status: Home / Self Care | Payer: Medicare Other | Source: Ambulatory Visit | Attending: Internal Medicine | Admitting: Internal Medicine

## 2014-11-09 ENCOUNTER — Other Ambulatory Visit: Payer: Self-pay | Admitting: Internal Medicine

## 2014-11-09 DIAGNOSIS — Z1231 Encounter for screening mammogram for malignant neoplasm of breast: Secondary | ICD-10-CM

## 2016-06-01 HISTORY — PX: EYE SURGERY: SHX253

## 2016-06-19 ENCOUNTER — Other Ambulatory Visit: Payer: Self-pay | Admitting: Specialist

## 2016-06-20 ENCOUNTER — Encounter
Admission: RE | Admit: 2016-06-20 | Discharge: 2016-06-20 | Disposition: A | Discharge status: Home / Self Care | Payer: Medicare Other | Source: Ambulatory Visit | Attending: Specialist | Admitting: Specialist

## 2016-06-20 HISTORY — DX: Headache, unspecified: R51.9

## 2016-06-20 HISTORY — DX: Headache: R51

## 2016-06-20 NOTE — Pre-Procedure Instructions (Signed)
Patient Instructions - Azzie Glatter, MD - 06/20/2016 10:30 AM EDT Pt is stable to undergo Rt Carpal tunnel surgery and risk of peri-op complications is low  Check Met-b today. Follow up as scheduled     Progress Notes - in this encounter  Azzie Glatter, MD - 06/20/2016 10:30 AM EDT Formatting of this note may be different from the original. Chief Complaint  Patient presents with  . Follow-up  surgery clearance   HPI  Nichole Sanchez is a 76 y.o. here for a pre -op exam  Pt is scheduled for Rt Carpal tunnel surgery next week  Occasionally gets short of breath- does not use inhalers (Unable to tolerate Pro Air, Advair discus,Symbicort or Xopenex)  Ex Smoker- quit 40 yrs back (< 1/2 ppd x 3-4 yrs). Recent labs; Hgb;15.2, Sugar; 92, Se Creat; 0.9, Total cholesterol; 257, Triglycerides; 449 and TSH; 5.179 Rest of 10 point review of systems is normal  Outpatient Encounter Prescriptions as of 06/20/2016  Medication Sig Dispense Refill  . amLODIPine (NORVASC) 2.5 MG tablet Take 1 tablet (2.5 mg total) by mouth once daily. 90 tablet 3  . biotin 1 mg Cap Take by mouth.  . cholecalciferol (VITAMIN D3) 2,000 unit tablet Take 2,000 Units by mouth once daily.  . cyanocobalamin (VITAMIN B12) 1000 MCG tablet Take 1,000 mcg by mouth once daily.  Marland Kitchen levothyroxine (SYNTHROID, LEVOTHROID) 75 MCG tablet Take 1 tablet (75 mcg total) by mouth once daily. Take on an empty stomach with a glass of water at least 30-60 minutes before breakfast. 90 tablet 3  . MULTIVIT WITH CALCIUM,IRON,MIN (MULTIPLE VITAMIN, WOMENS ORAL) Take by mouth.  . omega-3 fatty acids/fish oil 340-1,000 mg capsule Take 1 capsule by mouth 2 (two) times daily.  Marland Kitchen esomeprazole (NEXIUM) 40 MG DR capsule Take 1 capsule (40 mg total) by mouth once daily. (Patient not taking: Reported on 02/02/2016 ) 90 capsule 3   No facility-administered encounter medications on file as of 06/20/2016.   Allergies as of  06/20/2016 - Reviewed 06/20/2016  Allergen Reaction Noted  . Dextromethorphan hbr Rash 08/02/2014  . Advair diskus [fluticasone-salmeterol] Unknown 10/05/2013  . Bacitracin-polymyxin b Unknown 07/30/2014  . Codeine Unknown 07/30/2014  . Codeine sulfate Rash 09/08/2013  . Cyclobenzaprine Unknown 09/08/2013  . Hydrochlorothiazide Rash 09/08/2013  . Latex Rash 09/08/2013  . Lodine [etodolac] Unknown 09/08/2013  . Naproxen Unknown 07/30/2014  . Neomycin-bacitracnzn-polymyxnb Unknown 07/30/2014  . Neosporin [benzalkonium chloride] Unknown 09/08/2013  . Nexium [esomeprazole magnesium] Rash 09/08/2013  . Nystatin Rash 09/08/2013  . Percocet [oxycodone-acetaminophen] Rash 09/08/2013  . Prednisone Unknown 09/08/2013  . Proair hfa [albuterol sulfate] Swelling 10/05/2013  . Propylene glycol Hives 09/08/2013  . Tussionex pennkinetic er [hydrocodone-chlorpheniramine] Rash 09/08/2013  . Tylenol [acetaminophen] Swelling 09/08/2013   Past Medical History:  Diagnosis Date  . Asthma without status asthmaticus, unspecified  . Degenerative arthritis of knee, bilateral  . Dermatitis, unspecified  severe. Allergic to propylene glyco  . Elevated blood pressure  . GERD (gastroesophageal reflux disease)  . Hemorrhoids  with banding, 2011  . History of adenomatous polyp of colon 06/18/2016  . Hyperlipidemia, unspecified  . Hypertension  . Hypothyroid, unspecified  . Migraines  . Multiple drug allergies   Past Surgical History:  Procedure Laterality Date  . Oral cyst removed in the 1990s by Dr. Rosana Hoes in [place]  . APPENDECTOMY  . CHOLECYSTECTOMY 2011  . COLONOSCOPY 04/01/2002  Int Hemorrhoids, Diverticulosis  . COLONOSCOPY 02/23/2009  Adenomatous Polyps: CBF 02/2014; Recall Ltr mailed  12/14/2013 (dw)  . DILATION AND CURETTAGE, DIAGNOSTIC / THERAPEUTIC  . EGD 02/23/2009, 04/01/2002, 11/24/1999  . Hemorrhoids banding surgery  . Joint and tendon transplant on right wrist in late 1990s  .  JOINT REPLACEMENT Right  knee  . KNEE ARTHROSCOPY Left 2014  . miscarriages  . TONSILLECTOMY 1963  . Vaginal hysterectomy in 1986, ovaries intact   Vitals:  06/20/16 1029  BP: 140/88  Pulse: 91   Office Visit on 06/20/2016  Component Date Value Ref Range Status  . Vent Rate (bpm) 06/20/2016 88 Preliminary  . PR Interval (msec) 06/20/2016 150 Preliminary  . QRS Interval (msec) 06/20/2016 82 Preliminary  . QT Interval (msec) 06/20/2016 374 Preliminary  . QTc (msec) 06/20/2016 452 Preliminary   Exam Blood pressure 140/88, pulse 91, height 154.9 cm (_0 ), weight 84.4 kg (186 lb), SpO2 96 %. Wt Readings from Last 3 Encounters:  06/20/16 84.4 kg (186 lb)  06/18/16 86 kg (189 lb 9.6 oz)  02/02/16 85.5 kg (188 lb 9.6 oz)   General. Well appearing; NAD; VS reviewed  Eyes. Sclera and conjunctiva clear; Vision grossly intact; extraocular movements intact Oropharynx. No suspicious lesions Neck. Supple. No swelling, masses, thyroid normal size, no masses palpated.  Lungs. Respirations unlabored; clear to auscultation bilaterally Cardiovascular. Heart regular rate and rhythm without murmurs, gallops, or rubs Abdomen: Non tender. No masses felt Skin. Normal color and turgor Neurologic. Alert and oriented x3; CN 2-12 grossly intact; no focal deficits  Assessment and Plan: 1 Pre op evaluation for Rt Carpal Tunnel surgery; Pt is stable to undergo Rt Carpal Tunnel surgery and risk of peri operative complications is low  Check Met-b today. EKG done today showed SR with poor R wave progression  2 Asthma; Mild intermittent Has not bothered her lately- - Declines inhalers and Singulair  Takes Over-the-counter Decongesant prn 3 HTN: Stable-On Norvasc DASH diet  4 Anxiety: On Alprozolam 0.28m 1/2 tab po qd prn 5 Hypothyroidism: On Synthroid 75 micrograms po daily- Last TSH -ok (5.179 ) 6 Health Maintenance: Up to date with Flu shot and Pneumovax.and Prevnar 13 Will check CBC, Met-c,  lipids, and TSH 1 week prior to next visit Continue efforts at weight reduction. Discussed results of labs  Follow up as scheduled  VTracie HarrierMD     Plan of Treatment - as of this encounter   Upcoming Encounters Upcoming Encounters  Date Type Specialty Care Team Description  01/28/2017 Ancillary Orders Lab HAzzie Glatter MD  [address] [place] [address] [phone](Fax)    02/04/2017 Office Visit Internal Medicine HAzzie Glatter MD  [address] [place] [address] [phone](Fax)     Pending Results Pending Results  Name Priority Associated Diagnoses Date/Time  ECG 12-lead Routine Pre-op examination  06/20/2016 10:36 AM EDT  Basic Metabolic Panel (BMP) Routine Pre-op examination  06/20/2016 10:59 AM EDT   Visit Diagnoses    Diagnosis  Pre-op examination - Primary  Essential hypertension  Mild intermittent asthma without status asthmaticus without complication  Mixed hyperlipidemia  Acquired hypothyroidism, unspecified   Discontinued Medications - as of this encounter   Prescription Sig. Discontinue Reason Start Date End Date  esomeprazole (NEXIUM) 40 MG DR capsule  Take 1 capsule (40 mg total) by mouth once daily.  02/01/2015 06/20/2016   Images Document Information   Primary Care Provider VAzzie GlatterMD (Sep. 08, 2015 -  Present) [phone] (Work) [phone] (Fax) [address] [place], [place] [address]  Document Coverage Dates Jun. 20, 2018  [place], [place] [address]   Encounter Providers Azzie Glatter MD (Attending) [phone] (Work) [phone] (Fax) [address] [place],  [address]

## 2016-06-20 NOTE — Patient Instructions (Signed)
  Your procedure is scheduled on: 06-25-16 Report to Same Day Surgery 2nd floor medical mall [facility] Entrance-take elevator on left to 2nd floor.  Check in with surgery information desk.) To find out your arrival time please call [phone] between 1PM - 3PM on 06-22-16  Remember: Instructions that are not followed completely may result in serious medical risk, up to and including death, or upon the discretion of your surgeon and anesthesiologist your surgery may need to be rescheduled.    _x___ 1. Do not eat food or drink liquids after midnight. No gum chewing or hard candies.     __x__ 2. No Alcohol for 24 hours before or after surgery.   __x__3. No Smoking for 24 prior to surgery.   ____  4. Bring all medications with you on the day of surgery if instructed.    __x__ 5. Notify your doctor if there is any change in your medical condition     (cold, fever, infections).     Do not wear jewelry, make-up, hairpins, clips or nail polish.  Do not wear lotions, powders, or perfumes. You may wear deodorant.  Do not shave 48 hours prior to surgery. Men may shave face and neck.  Do not bring valuables to the hospital.    [facility] is not responsible for any belongings or valuables.               Contacts, dentures or bridgework may not be worn into surgery.  Leave your suitcase in the car. After surgery it may be brought to your room.  For patients admitted to the hospital, discharge time is determined by your  treatment team.   Patients discharged the day of surgery will not be allowed to drive home.  You will need someone to drive you home and stay with you the night of your procedure.    Please read over the following fact sheets that you were given:   [facility] Preparing for Surgery and or MRSA Information   _x___ Take anti-hypertensive (unless it includes a diuretic), cardiac, seizure, asthma,     anti-reflux and psychiatric medicines. These include:  1. AMLODIPINE  2.  LEVOTHYROXINE  3.  4.  5.  6.  ____Fleets enema or Magnesium Citrate as directed.   ____ Use CHG Soap or sage wipes as directed on instruction sheet   ____ Use inhalers on the day of surgery and bring to hospital day of surgery  ____ Stop Metformin and Janumet 2 days prior to surgery.    ____ Take 1/2 of usual insulin dose the night before surgery and none on the morning     surgery.   ____ Follow recommendations from Cardiologist, Pulmonologist or PCP regarding stopping Aspirin, Coumadin, Pllavix ,Eliquis, Effient, or Pradaxa, and Pletal.  X____Stop Anti-inflammatories such as Advil, Aleve, Ibuprofen, Motrin, Naproxen, Naprosyn, Goodies powders or aspirin products NOW-OK to take Tylenol    _x___ Stop supplements until after surgery-STOP FISH OIL AND BIOTIN NOW   ____ Bring C-Pap to the hospital.

## 2016-06-24 MED ORDER — CLINDAMYCIN PHOSPHATE 600 MG/50ML IV SOLN
600.0000 mg | Freq: Once | INTRAVENOUS | Status: AC
  Administered 2016-06-25: 600 mg via INTRAVENOUS

## 2016-06-24 MED ORDER — CEFAZOLIN SODIUM-DEXTROSE 2-4 GM/100ML-% IV SOLN
2.0000 g | INTRAVENOUS | Status: AC
  Administered 2016-06-25: 2 g via INTRAVENOUS

## 2016-06-25 ENCOUNTER — Encounter
Admission: RE | Disposition: A | Discharge status: Home / Self Care | Payer: Self-pay | Source: Ambulatory Visit | Attending: Specialist

## 2016-06-25 ENCOUNTER — Encounter: Payer: Self-pay | Admitting: *Deleted

## 2016-06-25 ENCOUNTER — Ambulatory Visit
Admission: RE | Admit: 2016-06-25 | Discharge: 2016-06-25 | Disposition: A | Discharge status: Home / Self Care | Payer: Medicare Other | Source: Ambulatory Visit | Attending: Specialist | Admitting: Specialist

## 2016-06-25 ENCOUNTER — Ambulatory Visit: Payer: Medicare Other | Admitting: Anesthesiology

## 2016-06-25 DIAGNOSIS — E039 Hypothyroidism, unspecified: Secondary | ICD-10-CM | POA: Diagnosis not present

## 2016-06-25 DIAGNOSIS — K219 Gastro-esophageal reflux disease without esophagitis: Secondary | ICD-10-CM | POA: Diagnosis not present

## 2016-06-25 DIAGNOSIS — G5601 Carpal tunnel syndrome, right upper limb: Secondary | ICD-10-CM | POA: Diagnosis present

## 2016-06-25 DIAGNOSIS — I10 Essential (primary) hypertension: Secondary | ICD-10-CM | POA: Insufficient documentation

## 2016-06-25 DIAGNOSIS — Z79899 Other long term (current) drug therapy: Secondary | ICD-10-CM | POA: Insufficient documentation

## 2016-06-25 HISTORY — PX: CARPAL TUNNEL RELEASE: SHX101

## 2016-06-25 SURGERY — CARPAL TUNNEL RELEASE
Anesthesia: General | Laterality: Right

## 2016-06-25 MED ORDER — CLINDAMYCIN PHOSPHATE 600 MG/50ML IV SOLN
INTRAVENOUS | Status: AC
  Filled 2016-06-25: qty 50

## 2016-06-25 MED ORDER — ONDANSETRON HCL 4 MG/2ML IJ SOLN: 4.0000 mg | Freq: Once | INTRAMUSCULAR | Status: DC | PRN

## 2016-06-25 MED ORDER — DEXAMETHASONE SODIUM PHOSPHATE 10 MG/ML IJ SOLN
INTRAMUSCULAR | Status: DC | PRN
  Administered 2016-06-25: 10 mg via INTRAVENOUS

## 2016-06-25 MED ORDER — MELOXICAM 7.5 MG PO TABS
ORAL_TABLET | ORAL | Status: AC
  Filled 2016-06-25: qty 2

## 2016-06-25 MED ORDER — FENTANYL CITRATE (PF) 100 MCG/2ML IJ SOLN
INTRAMUSCULAR | Status: AC
  Filled 2016-06-25: qty 2

## 2016-06-25 MED ORDER — FENTANYL CITRATE (PF) 100 MCG/2ML IJ SOLN
INTRAMUSCULAR | Status: DC | PRN
  Administered 2016-06-25 (×2): 50 ug via INTRAVENOUS

## 2016-06-25 MED ORDER — FAMOTIDINE 20 MG PO TABS
20.0000 mg | ORAL_TABLET | Freq: Once | ORAL | Status: AC
  Administered 2016-06-25: 20 mg via ORAL

## 2016-06-25 MED ORDER — LACTATED RINGERS IV SOLN
INTRAVENOUS | Status: DC
  Administered 2016-06-25: 12:00:00 via INTRAVENOUS

## 2016-06-25 MED ORDER — MELOXICAM 7.5 MG PO TABS
15.0000 mg | ORAL_TABLET | Freq: Once | ORAL | Status: AC
  Administered 2016-06-25: 15 mg via ORAL

## 2016-06-25 MED ORDER — GABAPENTIN 400 MG PO CAPS
400.0000 mg | ORAL_CAPSULE | Freq: Once | ORAL | Status: AC
  Administered 2016-06-25: 400 mg via ORAL

## 2016-06-25 MED ORDER — BUPIVACAINE HCL 0.5 % IJ SOLN
INTRAMUSCULAR | Status: DC | PRN
  Administered 2016-06-25: 17 mL

## 2016-06-25 MED ORDER — CEFAZOLIN SODIUM-DEXTROSE 2-4 GM/100ML-% IV SOLN
INTRAVENOUS | Status: AC
  Filled 2016-06-25: qty 100

## 2016-06-25 MED ORDER — GABAPENTIN 400 MG PO CAPS
ORAL_CAPSULE | ORAL | Status: AC
  Filled 2016-06-25: qty 1

## 2016-06-25 MED ORDER — ONDANSETRON HCL 4 MG/2ML IJ SOLN
INTRAMUSCULAR | Status: AC
  Filled 2016-06-25: qty 2

## 2016-06-25 MED ORDER — ONDANSETRON HCL 4 MG/2ML IJ SOLN
INTRAMUSCULAR | Status: DC | PRN
  Administered 2016-06-25: 4 mg via INTRAVENOUS

## 2016-06-25 MED ORDER — FAMOTIDINE 20 MG PO TABS
ORAL_TABLET | ORAL | Status: AC
  Filled 2016-06-25: qty 1

## 2016-06-25 MED ORDER — DEXAMETHASONE SODIUM PHOSPHATE 10 MG/ML IJ SOLN
INTRAMUSCULAR | Status: AC
  Filled 2016-06-25: qty 1

## 2016-06-25 MED ORDER — BUPIVACAINE HCL (PF) 0.5 % IJ SOLN
INTRAMUSCULAR | Status: AC
  Filled 2016-06-25: qty 30

## 2016-06-25 MED ORDER — LIDOCAINE HCL (PF) 2 % IJ SOLN
INTRAMUSCULAR | Status: AC
  Filled 2016-06-25: qty 2

## 2016-06-25 MED ORDER — CHLORHEXIDINE GLUCONATE CLOTH 2 % EX PADS: 6.0000 | MEDICATED_PAD | Freq: Once | CUTANEOUS | Status: DC

## 2016-06-25 MED ORDER — PROPOFOL 10 MG/ML IV BOLUS
INTRAVENOUS | Status: AC
  Filled 2016-06-25: qty 20

## 2016-06-25 MED ORDER — TRAMADOL HCL 50 MG PO TABS: 50.0000 mg | ORAL_TABLET | Freq: Four times a day (QID) | ORAL | 2 refills | Status: DC | PRN

## 2016-06-25 MED ORDER — FENTANYL CITRATE (PF) 100 MCG/2ML IJ SOLN: 25.0000 ug | INTRAMUSCULAR | Status: DC | PRN

## 2016-06-25 MED ORDER — GABAPENTIN 400 MG PO CAPS
400.0000 mg | ORAL_CAPSULE | Freq: Two times a day (BID) | ORAL | 3 refills | Status: DC
Start: 2016-06-25 — End: 2017-03-13

## 2016-06-25 SURGICAL SUPPLY — 25 items
BLADE SURG MINI STRL (BLADE) ×2 IMPLANT
BNDG ESMARK 4X12 TAN STRL LF (GAUZE/BANDAGES/DRESSINGS) ×2 IMPLANT
CANISTER SUCT 1200ML W/VALVE (MISCELLANEOUS) ×2 IMPLANT
CHLORAPREP W/TINT 26ML (MISCELLANEOUS) ×2 IMPLANT
CUFF TOURN 18 STER (MISCELLANEOUS) IMPLANT
ELECT REM PT RETURN 9FT ADLT (ELECTROSURGICAL) ×2
ELECTRODE REM PT RTRN 9FT ADLT (ELECTROSURGICAL) ×1 IMPLANT
GAUZE FLUFF 18X24 1PLY STRL (GAUZE/BANDAGES/DRESSINGS) ×2 IMPLANT
GAUZE PETRO XEROFOAM 1X8 (MISCELLANEOUS) ×2 IMPLANT
GLOVE BIO SURGEON STRL SZ8 (GLOVE) ×2 IMPLANT
GOWN STRL REUS W/ TWL LRG LVL3 (GOWN DISPOSABLE) ×1 IMPLANT
GOWN STRL REUS W/TWL LRG LVL3 (GOWN DISPOSABLE) ×2
GOWN STRL REUS W/TWL LRG LVL4 (GOWN DISPOSABLE) ×2 IMPLANT
KIT RM TURNOVER STRD PROC AR (KITS) ×2 IMPLANT
NS IRRIG 500ML POUR BTL (IV SOLUTION) ×2 IMPLANT
PACK EXTREMITY ARMC (MISCELLANEOUS) ×2 IMPLANT
PAD PREP 24X41 OB/GYN DISP (PERSONAL CARE ITEMS) ×2 IMPLANT
SPLINT CAST 1 STEP 3X12 (MISCELLANEOUS) ×2 IMPLANT
STOCKINETTE 48X4 2 PLY STRL (GAUZE/BANDAGES/DRESSINGS) ×1 IMPLANT
STOCKINETTE BIAS CUT 4 980044 (GAUZE/BANDAGES/DRESSINGS) ×2 IMPLANT
STOCKINETTE STRL 4IN 9604848 (GAUZE/BANDAGES/DRESSINGS) ×2 IMPLANT
SUT ETHILON 4-0 (SUTURE) ×2
SUT ETHILON 4-0 FS2 18XMFL BLK (SUTURE) ×1
SUT ETHILON 5-0 FS-2 18 BLK (SUTURE) ×2 IMPLANT
SUTURE ETHLN 4-0 FS2 18XMF BLK (SUTURE) ×1 IMPLANT

## 2016-06-25 NOTE — Anesthesia Postprocedure Evaluation (Signed)
Anesthesia Post Note  Patient: Nichole Sanchez  Procedure(s) Performed: Procedure(s) (LRB): CARPAL TUNNEL RELEASE (Right)  Patient location during evaluation: PACU Anesthesia Type: General Level of consciousness: awake and alert Pain management: pain level controlled Vital Signs Assessment: post-procedure vital signs reviewed and stable Respiratory status: spontaneous breathing, nonlabored ventilation, respiratory function stable and patient connected to nasal cannula oxygen Cardiovascular status: blood pressure returned to baseline and stable Postop Assessment: no signs of nausea or vomiting Anesthetic complications: no     Last Vitals:  Vitals:   06/25/16 1439 06/25/16 1444  BP: (!) 170/74 (!) 157/59  Pulse: 69   Resp: 14 16  Temp: (!) 35.9 C 36.2 C    Last Pain:  Vitals:   06/25/16 1349  TempSrc:   PainSc: Asleep                 Precious Haws Leeloo Silverthorne

## 2016-06-25 NOTE — Anesthesia Procedure Notes (Signed)
Procedure Name: LMA Insertion Date/Time: 06/25/2016 12:59 PM Performed by: Jonna Clark Pre-anesthesia Checklist: Patient identified, Patient being monitored, Timeout performed, Emergency Drugs available and Suction available Patient Re-evaluated:Patient Re-evaluated prior to inductionOxygen Delivery Method: Circle system utilized Preoxygenation: Pre-oxygenation with 100% oxygen Intubation Type: IV induction Ventilation: Mask ventilation without difficulty LMA: LMA inserted LMA Size: 4.0 Tube type: Oral Number of attempts: 1 Placement Confirmation: positive ETCO2 and breath sounds checked- equal and bilateral Tube secured with: Tape Dental Injury: Teeth and Oropharynx as per pre-operative assessment

## 2016-06-25 NOTE — Anesthesia Preprocedure Evaluation (Signed)
Anesthesia Evaluation  Patient identified by MRN, date of birth, ID band Patient awake    Reviewed: Allergy & Precautions, NPO status , Patient's Chart, lab work & pertinent test results  History of Anesthesia Complications Negative for: history of anesthetic complications  Airway Mallampati: I  TM Distance: >3 FB Neck ROM: Full    Dental  (+) Upper Dentures, Partial Lower   Pulmonary asthma (no meds, enviromentally triggered) , pneumonia, resolved,    Pulmonary exam normal        Cardiovascular hypertension, Pt. on medications Normal cardiovascular exam     Neuro/Psych  Headaches,  Neuromuscular disease    GI/Hepatic Neg liver ROS, GERD  Medicated,  Endo/Other  neg diabetes (pt denies)Hypothyroidism   Renal/GU negative Renal ROS  negative genitourinary   Musculoskeletal  (+) Arthritis , Osteoarthritis,    Abdominal (+) + obese,   Peds negative pediatric ROS (+)  Hematology negative hematology ROS (+)   Anesthesia Other Findings   Reproductive/Obstetrics                             Anesthesia Physical  Anesthesia Plan  ASA: II  Anesthesia Plan: General   Post-op Pain Management:    Induction: Intravenous  PONV Risk Score and Plan: 3 and Ondansetron, Dexamethasone, Propofol, Midazolam and Treatment may vary due to age or medical condition  Airway Management Planned: LMA  Additional Equipment:   Intra-op Plan:   Post-operative Plan: Extubation in OR  Informed Consent: I have reviewed the patients History and Physical, chart, labs and discussed the procedure including the risks, benefits and alternatives for the proposed anesthesia with the patient or authorized representative who has indicated his/her understanding and acceptance.   Dental advisory given  Plan Discussed with: CRNA and Surgeon  Anesthesia Plan Comments:         Anesthesia Quick Evaluation

## 2016-06-25 NOTE — Op Note (Signed)
06/25/2016  1:48 PM  PATIENT:  Nichole Sanchez    PRE-OPERATIVE DIAGNOSIS: RIGHT CARPAL TUNNEL SYNDROME POST-OPERATIVE DIAGNOSIS: RIGHT CARPAL TUNNEL SYNDROME  PROCEDURE:  RIGHT CARPAL TUNNEL RELEASE  SURGEON: Park Breed, MD    ANESTHESIA:   General  TOURNIQUET TIME: 16   MIN  PREOPERATIVE INDICATIONS:  Nichole Sanchez is a  76 y.o. female with a diagnosis of right carpal tunnel syndrome who failed conservative measures and elected for surgical management.    The risks benefits and alternatives were discussed with the patient preoperatively including but not limited to the risks of infection, bleeding, nerve injury, incomplete relief of symptoms, pillar pain, cardiopulmonary complications, the need for revision surgery, among others, and the patient was willing to proceed.  OPERATIVE FINDINGS: Thickened volar ligament and nerve compression.  OPERATIVE PROCEDURE: The patient is brought to the operating room placed in the supine position. General anesthesia was administered. The right upper extremity was prepped and draped in usual sterile fashion. Time out was performed. The arm was elevated and exsanguinated and the tourniquet was inflated. Incision was made in line with the radial border of the ring finger. The carpal tunnel transverse fascia was identified, cleaned, and incised sharply. The common sensory branches were visualized along with the superficial palmar arch and protected.  The median nerve was protected below  A Kelly clamp was  placed underneath the transverse carpal ligament, protecting the nerve. I released the ligament completely, and then released the proximal distal volar forearm fascia. The nerve was identified, and visualized, and protected throughout the case. The motor branch was intact upon inspection.  No masses or abnormalities were identified in ulnar bursa.  The wounds were irrigated copiously, and the wounds injected with 1/2% sensorcaine, and the skin  closed with nylon followed by a volar splint and sterile gauze.  Tourniquet was deflated with good return of blood flow to all fingers. Sponge and needle counts were correct.  The patient tolerated this well, with no complications. The patient was awakened and taken to recovery in good condition.

## 2016-06-25 NOTE — Transfer of Care (Signed)
Immediate Anesthesia Transfer of Care Note  Patient: Nichole Sanchez  Procedure(s) Performed: Procedure(s): CARPAL TUNNEL RELEASE (Right)  Patient Location: PACU  Anesthesia Type:General  Level of Consciousness: awake, alert  and oriented  Airway & Oxygen Therapy: Patient Spontanous Breathing and Patient connected to face mask oxygen  Post-op Assessment: Report given to RN and Post -op Vital signs reviewed and stable  Post vital signs: Reviewed and stable  Last Vitals:  Vitals:   06/25/16 1155 06/25/16 1349  BP: 139/67 (!) 156/76  Pulse: 77 90  Resp: 18   Temp: 36.8 C 36.3 C    Last Pain:  Vitals:   06/25/16 1349  TempSrc:   PainSc: Asleep         Complications: No apparent anesthesia complications

## 2016-06-25 NOTE — Anesthesia Post-op Follow-up Note (Cosign Needed)
Anesthesia QCDR form completed.        

## 2016-06-25 NOTE — Discharge Instructions (Signed)

## 2016-06-25 NOTE — H&P (Signed)
THE PATIENT WAS SEEN PRIOR TO SURGERY TODAY.  HISTORY, ALLERGIES, HOME MEDICATIONS AND OPERATIVE PROCEDURE WERE REVIEWED. RISKS AND BENEFITS OF SURGERY DISCUSSED WITH PATIENT AGAIN.  NO CHANGES FROM INITIAL HISTORY AND PHYSICAL NOTED.    

## 2016-06-26 ENCOUNTER — Encounter: Payer: Self-pay | Admitting: Specialist

## 2016-09-18 ENCOUNTER — Encounter: Payer: Self-pay | Admitting: *Deleted

## 2016-09-19 ENCOUNTER — Encounter: Payer: Self-pay | Admitting: *Deleted

## 2016-09-19 ENCOUNTER — Ambulatory Visit: Payer: Medicare Other | Admitting: Anesthesiology

## 2016-09-19 ENCOUNTER — Ambulatory Visit
Admission: RE | Admit: 2016-09-19 | Discharge: 2016-09-19 | Disposition: A | Discharge status: Home / Self Care | Payer: Medicare Other | Source: Ambulatory Visit | Attending: Unknown Physician Specialty | Admitting: Unknown Physician Specialty

## 2016-09-19 ENCOUNTER — Encounter
Admission: RE | Disposition: A | Discharge status: Home / Self Care | Payer: Self-pay | Source: Ambulatory Visit | Attending: Unknown Physician Specialty

## 2016-09-19 DIAGNOSIS — K640 First degree hemorrhoids: Secondary | ICD-10-CM | POA: Insufficient documentation

## 2016-09-19 DIAGNOSIS — Z1211 Encounter for screening for malignant neoplasm of colon: Secondary | ICD-10-CM | POA: Diagnosis not present

## 2016-09-19 DIAGNOSIS — Z91048 Other nonmedicinal substance allergy status: Secondary | ICD-10-CM | POA: Diagnosis not present

## 2016-09-19 DIAGNOSIS — Z9842 Cataract extraction status, left eye: Secondary | ICD-10-CM | POA: Insufficient documentation

## 2016-09-19 DIAGNOSIS — Z9841 Cataract extraction status, right eye: Secondary | ICD-10-CM | POA: Insufficient documentation

## 2016-09-19 DIAGNOSIS — K219 Gastro-esophageal reflux disease without esophagitis: Secondary | ICD-10-CM | POA: Diagnosis not present

## 2016-09-19 DIAGNOSIS — Z885 Allergy status to narcotic agent status: Secondary | ICD-10-CM | POA: Diagnosis not present

## 2016-09-19 DIAGNOSIS — I10 Essential (primary) hypertension: Secondary | ICD-10-CM | POA: Insufficient documentation

## 2016-09-19 DIAGNOSIS — Z79899 Other long term (current) drug therapy: Secondary | ICD-10-CM | POA: Diagnosis not present

## 2016-09-19 DIAGNOSIS — E039 Hypothyroidism, unspecified: Secondary | ICD-10-CM | POA: Diagnosis not present

## 2016-09-19 DIAGNOSIS — D123 Benign neoplasm of transverse colon: Secondary | ICD-10-CM | POA: Diagnosis not present

## 2016-09-19 DIAGNOSIS — Z91040 Latex allergy status: Secondary | ICD-10-CM | POA: Insufficient documentation

## 2016-09-19 DIAGNOSIS — Z87891 Personal history of nicotine dependence: Secondary | ICD-10-CM | POA: Diagnosis not present

## 2016-09-19 DIAGNOSIS — K5730 Diverticulosis of large intestine without perforation or abscess without bleeding: Secondary | ICD-10-CM | POA: Diagnosis not present

## 2016-09-19 DIAGNOSIS — Z96651 Presence of right artificial knee joint: Secondary | ICD-10-CM | POA: Insufficient documentation

## 2016-09-19 DIAGNOSIS — G5600 Carpal tunnel syndrome, unspecified upper limb: Secondary | ICD-10-CM | POA: Insufficient documentation

## 2016-09-19 DIAGNOSIS — J45909 Unspecified asthma, uncomplicated: Secondary | ICD-10-CM | POA: Diagnosis not present

## 2016-09-19 DIAGNOSIS — Z90710 Acquired absence of both cervix and uterus: Secondary | ICD-10-CM | POA: Diagnosis not present

## 2016-09-19 DIAGNOSIS — Z9049 Acquired absence of other specified parts of digestive tract: Secondary | ICD-10-CM | POA: Diagnosis not present

## 2016-09-19 DIAGNOSIS — M1990 Unspecified osteoarthritis, unspecified site: Secondary | ICD-10-CM | POA: Insufficient documentation

## 2016-09-19 DIAGNOSIS — D120 Benign neoplasm of cecum: Secondary | ICD-10-CM | POA: Diagnosis not present

## 2016-09-19 DIAGNOSIS — E785 Hyperlipidemia, unspecified: Secondary | ICD-10-CM | POA: Diagnosis not present

## 2016-09-19 DIAGNOSIS — Z888 Allergy status to other drugs, medicaments and biological substances status: Secondary | ICD-10-CM | POA: Diagnosis not present

## 2016-09-19 DIAGNOSIS — Z86010 Personal history of colonic polyps: Secondary | ICD-10-CM | POA: Diagnosis not present

## 2016-09-19 DIAGNOSIS — E669 Obesity, unspecified: Secondary | ICD-10-CM | POA: Insufficient documentation

## 2016-09-19 DIAGNOSIS — Z6833 Body mass index (BMI) 33.0-33.9, adult: Secondary | ICD-10-CM | POA: Insufficient documentation

## 2016-09-19 HISTORY — DX: Unspecified hemorrhoids: K64.9

## 2016-09-19 HISTORY — PX: COLONOSCOPY WITH PROPOFOL: SHX5780

## 2016-09-19 HISTORY — DX: Personal history of colonic polyps: Z86.010

## 2016-09-19 HISTORY — DX: Hyperlipidemia, unspecified: E78.5

## 2016-09-19 SURGERY — COLONOSCOPY WITH PROPOFOL
Anesthesia: General

## 2016-09-19 MED ORDER — PIPERACILLIN-TAZOBACTAM 3.375 G IVPB 30 MIN
3.3750 g | Freq: Once | INTRAVENOUS | Status: AC
  Administered 2016-09-19: 3.375 g via INTRAVENOUS
  Filled 2016-09-19: qty 50

## 2016-09-19 MED ORDER — SODIUM CHLORIDE 0.9 % IV SOLN
INTRAVENOUS | Status: DC
  Administered 2016-09-19: 08:00:00 via INTRAVENOUS

## 2016-09-19 MED ORDER — PIPERACILLIN-TAZOBACTAM 3.375 G IVPB
INTRAVENOUS | Status: AC
  Administered 2016-09-19: 3.375 g via INTRAVENOUS
  Filled 2016-09-19: qty 50

## 2016-09-19 MED ORDER — SODIUM CHLORIDE 0.9 % IV SOLN: INTRAVENOUS | Status: DC

## 2016-09-19 MED ORDER — LIDOCAINE HCL (PF) 2 % IJ SOLN
INTRAMUSCULAR | Status: AC
  Filled 2016-09-19: qty 2

## 2016-09-19 MED ORDER — PROPOFOL 500 MG/50ML IV EMUL
INTRAVENOUS | Status: AC
  Filled 2016-09-19: qty 50

## 2016-09-19 MED ORDER — FENTANYL CITRATE (PF) 100 MCG/2ML IJ SOLN
INTRAMUSCULAR | Status: DC | PRN
  Administered 2016-09-19 (×2): 50 ug via INTRAVENOUS

## 2016-09-19 MED ORDER — PROPOFOL 10 MG/ML IV BOLUS
INTRAVENOUS | Status: DC | PRN
  Administered 2016-09-19 (×2): 30 mg via INTRAVENOUS

## 2016-09-19 MED ORDER — FENTANYL CITRATE (PF) 100 MCG/2ML IJ SOLN
INTRAMUSCULAR | Status: AC
  Filled 2016-09-19: qty 2

## 2016-09-19 MED ORDER — PROPOFOL 500 MG/50ML IV EMUL
INTRAVENOUS | Status: DC | PRN
  Administered 2016-09-19: 120 ug/kg/min via INTRAVENOUS

## 2016-09-19 MED ORDER — LIDOCAINE HCL (CARDIAC) 20 MG/ML IV SOLN
INTRAVENOUS | Status: DC | PRN
  Administered 2016-09-19: 2 mL via INTRAVENOUS

## 2016-09-19 NOTE — H&P (Signed)
Primary Care Physician:  Tracie Harrier, MD Primary Gastroenterologist:  Dr. Vira Agar  Pre-Procedure History & Physical: HPI:  Nichole Sanchez is a 76 y.o. female is here for an colonoscopy.   Past Medical History:  Diagnosis Date  . Arthritis   . Asthma    well controlled-no inhalers  . Bronchitis   . Carpal tunnel syndrome   . Diverticulosis   . GERD (gastroesophageal reflux disease)    h/o  . Headache    h/o migraines 1980's  . Hemorrhoids   . History of colon polyps   . Hyperlipidemia   . Hypertension   . Hypothyroidism   . Pneumonia    in the past    Past Surgical History:  Procedure Laterality Date  . ABDOMINAL HYSTERECTOMY    . APPENDECTOMY    . CARDIAC CATHETERIZATION    . CARPAL TUNNEL RELEASE Right 06/25/2016   Procedure: CARPAL TUNNEL RELEASE;  Surgeon: Earnestine Leys, MD;  Location: [facility] ORS;  Service: Orthopedics;  Laterality: Right;  . CATARACT EXTRACTION W/PHACO Right 08/02/2014   Procedure: CATARACT EXTRACTION PHACO AND INTRAOCULAR LENS PLACEMENT (IOC);  Surgeon: Estill Cotta, MD;  Location: [facility] ORS;  Service: Ophthalmology;  Laterality: Right;  [place]:  01:30.3 AP%: 16.4 CDE: 28.22  Fluid Lot# [id number]  . CATARACT EXTRACTION W/PHACO Left 08/23/2014   Procedure: CATARACT EXTRACTION PHACO AND INTRAOCULAR LENS PLACEMENT (IOC);  Surgeon: Estill Cotta, MD;  Location: [facility] ORS;  Service: Ophthalmology;  Laterality: Left;  [place] 01:11.4 AP% 23.0 CDE 28.17 FLUID LOT# [id number]  . CHOLECYSTECTOMY    . COLONOSCOPY    . DILATION AND CURETTAGE OF UTERUS    . ESOPHAGOGASTRODUODENOSCOPY    . EYE SURGERY    . EYE SURGERY Bilateral 06/2016   LASER SURGERY "FOR CLOUDY EYES"  . hemorrhoids banding surgery    . joint and tendon transplant Right    wrist  . JOINT REPLACEMENT Right    Knee Replacement  . KNEE ARTHROSCOPY Left   . TONSILLECTOMY      Prior to Admission medications   Medication Sig Start Date End Date Taking? Authorizing Provider   amLODipine (NORVASC) 2.5 MG tablet Take 2.5 mg by mouth every morning.    Yes [provider]  levothyroxine (SYNTHROID, LEVOTHROID) 75 MCG tablet Take 75 mcg by mouth daily before breakfast.   Yes [provider]  acetaminophen (TYLENOL) 500 MG tablet Take 1,000 mg by mouth every 4 (four) hours as needed.    [provider]  B Complex-C (SUPER B COMPLEX PO) Take 1 tablet by mouth daily with lunch.    [provider]  Biotin 10000 MCG TABS Take 10,000 mcg by mouth daily.     [provider]  cholecalciferol (VITAMIN D) 1000 UNITS tablet Take 1,000 Units by mouth daily.    [provider]  gabapentin (NEURONTIN) 400 MG capsule Take 1 capsule (400 mg total) by mouth 2 (two) times daily. 06/25/16   Earnestine Leys, MD  loratadine (ALLERGY RELIEF) 10 MG tablet Take 10 mg by mouth daily as needed for allergies.    [provider]  Multiple Vitamins-Minerals (EQ COMPLETE MULTIVIT ADULT 50+ PO) Take 1 tablet by mouth daily.    [provider]  Omega-3 Fatty Acids (FISH OIL) 1000 MG CAPS Take 1 capsule by mouth 2 (two) times daily.     [provider]  traMADol (ULTRAM) 50 MG tablet Take 1 tablet (50 mg total) by mouth every 6 (six) hours as needed for moderate  pain. 06/25/16   Earnestine Leys, MD    Allergies as of 08/20/2016 - Review Complete 06/25/2016  Allergen Reaction Noted  . Tussin cough [dextromethorphan hbr] Rash 08/02/2014  . Advair diskus [fluticasone-salmeterol] Other (See Comments) 06/20/2016  . Albuterol sulfate Swelling 06/20/2016  . Benzalkonium chloride  06/20/2016  . Cyclobenzaprine  07/30/2014  . Etodolac  06/20/2016  . Hctz [hydrochlorothiazide] Other (See Comments) 06/20/2016  . Neosporin [neomycin-bacitracin zn-polymyx] Other (See Comments) 07/30/2014  . Prednisone  07/30/2014  . Codeine Nausea And Vomiting and Rash 07/30/2014  . Esomeprazole Rash 06/20/2016  . Latex Rash 06/20/2016  . Naproxen  Palpitations 07/30/2014  . Nystatin Rash 06/20/2016  . Oxycodone-acetaminophen Rash 06/20/2016  . Polysporin [bacitracin-polymyxin b] Rash 07/30/2014  . Propylene glycol Rash and Other (See Comments) 07/30/2014  . Tape Rash 06/20/2016    History reviewed. No pertinent family history.  Social History   Social History  . Marital status: Married    Spouse name: N/A  . Number of children: N/A  . Years of education: N/A   Occupational History  . Not on file.   Social History Main Topics  . Smoking status: Former Research scientist (life sciences)  . Smokeless tobacco: Never Used  . Alcohol use No  . Drug use: No  . Sexual activity: Not on file   Other Topics Concern  . Not on file   Social History Narrative  . No narrative on file    Review of Systems: See HPI, otherwise negative ROS  Physical Exam: BP (!) 142/92   Pulse 100   Temp (!) 97 F (36.1 C) (Tympanic)   Resp 18   Ht 5\' 2"  (1.575 m)   Wt 83.9 kg (185 lb)   SpO2 97%   BMI 33.84 kg/m  General:   Alert,  pleasant and cooperative in NAD Head:  Normocephalic and atraumatic. Neck:  Supple; no masses or thyromegaly. Lungs:  Clear throughout to auscultation.    Heart:  Regular rate and rhythm. Abdomen:  Soft, nontender and nondistended. Normal bowel sounds, without guarding, and without rebound.   Neurologic:  Alert and  oriented x4;  grossly normal neurologically.  Impression/Plan: Nichole Sanchez is here for an colonoscopy to be performed for [facility] colon polyps.  Need Zosyn for previous TRKR.  Risks, benefits, limitations, and alternatives regarding  colonoscopy have been reviewed with the patient.  Questions have been answered.  All parties agreeable.   Gaylyn Cheers, MD  09/19/2016, 7:37 AM

## 2016-09-19 NOTE — Transfer of Care (Signed)
Immediate Anesthesia Transfer of Care Note  Patient: Nichole Sanchez  Procedure(s) Performed: Procedure(s): COLONOSCOPY WITH PROPOFOL (N/A)  Patient Location: PACU  Anesthesia Type:General  Level of Consciousness: awake and alert   Airway & Oxygen Therapy: Patient Spontanous Breathing and Patient connected to nasal cannula oxygen  Post-op Assessment: Report given to RN and Post -op Vital signs reviewed and stable  Post vital signs: Reviewed  Last Vitals:  Vitals:   09/19/16 0723 09/19/16 0728  BP:  (!) 142/92  Pulse: 100   Resp: 18   Temp: (!) 36.1 C   SpO2: 97%     Last Pain:  Vitals:   09/19/16 0723  TempSrc: Tympanic         Complications: No apparent anesthesia complications

## 2016-09-19 NOTE — Anesthesia Post-op Follow-up Note (Signed)
Anesthesia QCDR form completed.        

## 2016-09-19 NOTE — Anesthesia Preprocedure Evaluation (Signed)
Anesthesia Evaluation  Patient identified by MRN, date of birth, ID band Patient awake    Reviewed: Allergy & Precautions, NPO status , Patient's Chart, lab work & pertinent test results  Airway Mallampati: III       Dental  (+) Teeth Intact   Pulmonary asthma , former smoker,    breath sounds clear to auscultation       Cardiovascular Exercise Tolerance: Good hypertension, Pt. on medications  Rhythm:Regular     Neuro/Psych  Headaches,    GI/Hepatic Neg liver ROS, GERD  Medicated,  Endo/Other  Hypothyroidism   Renal/GU negative Renal ROS     Musculoskeletal   Abdominal (+) + obese,   Peds negative pediatric ROS (+)  Hematology negative hematology ROS (+)   Anesthesia Other Findings   Reproductive/Obstetrics                             Anesthesia Physical Anesthesia Plan  ASA: II  Anesthesia Plan: General   Post-op Pain Management:    Induction: Intravenous  PONV Risk Score and Plan: 0  Airway Management Planned: Natural Airway and Nasal Cannula  Additional Equipment:   Intra-op Plan:   Post-operative Plan:   Informed Consent: I have reviewed the patients History and Physical, chart, labs and discussed the procedure including the risks, benefits and alternatives for the proposed anesthesia with the patient or authorized representative who has indicated his/her understanding and acceptance.     Plan Discussed with: Surgeon  Anesthesia Plan Comments:         Anesthesia Quick Evaluation

## 2016-09-19 NOTE — Op Note (Signed)
[facility] Gastroenterology Patient Name: Nichole Sanchez Procedure Date: 09/19/2016 7:29 AM MRN: [id number] Account #: [id number] Date of Birth: 09/11/1940 Admit Type: Outpatient Age: 76 Room: [facility] ENDO ROOM 3 Gender: Female Note Status: Finalized Procedure:            Colonoscopy Indications:          High risk colon cancer surveillance: Personal history                        of colonic polyps Providers:            Manya Silvas, MD Referring MD:         Tracie Harrier, MD (Referring MD) Medicines:            Propofol per Anesthesia Complications:        No immediate complications. Procedure:            Pre-Anesthesia Assessment:                       - After reviewing the risks and benefits, the patient                        was deemed in satisfactory condition to undergo the                        procedure.                       After obtaining informed consent, the colonoscope was                        passed under direct vision. Throughout the procedure,                        the patient's blood pressure, pulse, and oxygen                        saturations were monitored continuously. The                        Colonoscope was introduced through the anus and                        advanced to the the cecum, identified by appendiceal                        orifice and ileocecal valve. The colonoscopy was                        performed without difficulty. The patient tolerated the                        procedure well. The quality of the bowel preparation                        was excellent. Findings:      A diminutive polyp was found in the cecum. The polyp was sessile. The       polyp was removed with a jumbo cold forceps. Resection and retrieval       were complete.      A diminutive polyp was found in  the hepatic flexure. The polyp was       sessile. The polyp was removed with a jumbo cold forceps. Some bleeding       occured after the  biopsies Resection and retrieval were complete. There       was some brief bleeding after the polypectomy, two hemostatic clips were       successfully placed. There was no bleeding at the end of the procedure.      Multiple medium-mouthed diverticula were found in the sigmoid colon,       descending colon and transverse colon.      Internal hemorrhoids were found during endoscopy. The hemorrhoids were       small, medium-sized and Grade I (internal hemorrhoids that do not       prolapse). Impression:           - One diminutive polyp in the cecum, removed with a                        jumbo cold forceps. Resected and retrieved.                       - One diminutive polyp at the hepatic flexure, removed                        with a jumbo cold forceps. Resected and retrieved.                        Clips were placed.                       - Diverticulosis in the sigmoid colon, in the                        descending colon and in the transverse colon.                       - Internal hemorrhoids. Recommendation:       - Await pathology results. Manya Silvas, MD 09/19/2016 8:27:13 AM This report has been signed electronically. Number of Addenda: 0 Note Initiated On: 09/19/2016 7:29 AM Scope Withdrawal Time: 0 hours 13 minutes 32 seconds  Total Procedure Duration: 0 hours 19 minutes 26 seconds       [facility]

## 2016-09-19 NOTE — Anesthesia Postprocedure Evaluation (Signed)
Anesthesia Post Note  Patient: Nichole Sanchez  Procedure(s) Performed: Procedure(s) (LRB): COLONOSCOPY WITH PROPOFOL (N/A)  Patient location during evaluation: PACU Anesthesia Type: General Level of consciousness: awake Pain management: pain level controlled Vital Signs Assessment: post-procedure vital signs reviewed and stable Respiratory status: spontaneous breathing Cardiovascular status: stable Anesthetic complications: no     Last Vitals:  Vitals:   09/19/16 0830 09/19/16 0840  BP: 128/64 (!) 105/52  Pulse: 78 78  Resp: 18 18  Temp:    SpO2: 98% 98%    Last Pain:  Vitals:   09/19/16 0820  TempSrc: Tympanic                 VAN STAVEREN,Arryanna Holquin

## 2016-09-20 ENCOUNTER — Encounter: Payer: Self-pay | Admitting: Unknown Physician Specialty

## 2016-09-21 LAB — SURGICAL PATHOLOGY

## 2017-02-23 DIAGNOSIS — I5189 Other ill-defined heart diseases: Secondary | ICD-10-CM

## 2017-02-23 HISTORY — DX: Other ill-defined heart diseases: I51.89

## 2017-03-12 ENCOUNTER — Other Ambulatory Visit: Payer: Self-pay

## 2017-03-12 ENCOUNTER — Encounter
Admission: RE | Disposition: A | Discharge status: Home / Self Care | Payer: Self-pay | Source: Ambulatory Visit | Attending: Cardiology

## 2017-03-12 ENCOUNTER — Observation Stay
Admission: RE | Admit: 2017-03-12 | Discharge: 2017-03-13 | Disposition: A | Discharge status: Home / Self Care | Payer: Medicare Other | Source: Ambulatory Visit | Attending: Cardiology | Admitting: Cardiology

## 2017-03-12 DIAGNOSIS — Z885 Allergy status to narcotic agent status: Secondary | ICD-10-CM | POA: Diagnosis not present

## 2017-03-12 DIAGNOSIS — J45909 Unspecified asthma, uncomplicated: Secondary | ICD-10-CM | POA: Diagnosis not present

## 2017-03-12 DIAGNOSIS — Z886 Allergy status to analgesic agent status: Secondary | ICD-10-CM | POA: Diagnosis not present

## 2017-03-12 DIAGNOSIS — E782 Mixed hyperlipidemia: Secondary | ICD-10-CM | POA: Insufficient documentation

## 2017-03-12 DIAGNOSIS — Z8249 Family history of ischemic heart disease and other diseases of the circulatory system: Secondary | ICD-10-CM | POA: Diagnosis not present

## 2017-03-12 DIAGNOSIS — Z87891 Personal history of nicotine dependence: Secondary | ICD-10-CM | POA: Insufficient documentation

## 2017-03-12 DIAGNOSIS — Z888 Allergy status to other drugs, medicaments and biological substances status: Secondary | ICD-10-CM | POA: Diagnosis not present

## 2017-03-12 DIAGNOSIS — Z7982 Long term (current) use of aspirin: Secondary | ICD-10-CM | POA: Insufficient documentation

## 2017-03-12 DIAGNOSIS — Z79899 Other long term (current) drug therapy: Secondary | ICD-10-CM | POA: Diagnosis not present

## 2017-03-12 DIAGNOSIS — Z79890 Hormone replacement therapy (postmenopausal): Secondary | ICD-10-CM | POA: Diagnosis not present

## 2017-03-12 DIAGNOSIS — Z881 Allergy status to other antibiotic agents status: Secondary | ICD-10-CM | POA: Diagnosis not present

## 2017-03-12 DIAGNOSIS — I10 Essential (primary) hypertension: Secondary | ICD-10-CM | POA: Insufficient documentation

## 2017-03-12 DIAGNOSIS — E039 Hypothyroidism, unspecified: Secondary | ICD-10-CM | POA: Diagnosis not present

## 2017-03-12 DIAGNOSIS — I2510 Atherosclerotic heart disease of native coronary artery without angina pectoris: Principal | ICD-10-CM | POA: Diagnosis present

## 2017-03-12 DIAGNOSIS — R079 Chest pain, unspecified: Secondary | ICD-10-CM | POA: Diagnosis present

## 2017-03-12 HISTORY — PX: LEFT HEART CATH AND CORONARY ANGIOGRAPHY: CATH118249

## 2017-03-12 HISTORY — PX: CORONARY STENT INTERVENTION: CATH118234

## 2017-03-12 LAB — POCT ACTIVATED CLOTTING TIME: Activated Clotting Time: 400 s

## 2017-03-12 SURGERY — LEFT HEART CATH AND CORONARY ANGIOGRAPHY
Anesthesia: Moderate Sedation

## 2017-03-12 MED ORDER — ASPIRIN 81 MG PO CHEW: 81.0000 mg | CHEWABLE_TABLET | ORAL | Status: DC

## 2017-03-12 MED ORDER — SODIUM CHLORIDE 0.9% FLUSH: 3.0000 mL | Freq: Two times a day (BID) | INTRAVENOUS | Status: DC

## 2017-03-12 MED ORDER — SODIUM CHLORIDE 0.9 % WEIGHT BASED INFUSION: 1.0000 mL/kg/h | INTRAVENOUS | Status: AC

## 2017-03-12 MED ORDER — SODIUM CHLORIDE 0.9 % WEIGHT BASED INFUSION: 3.0000 mL/kg/h | INTRAVENOUS | Status: AC

## 2017-03-12 MED ORDER — MONTELUKAST SODIUM 10 MG PO TABS
10.0000 mg | ORAL_TABLET | Freq: Every day | ORAL | Status: DC
  Administered 2017-03-12: 10 mg via ORAL
  Filled 2017-03-12 (×2): qty 1

## 2017-03-12 MED ORDER — AMLODIPINE BESYLATE 5 MG PO TABS
2.5000 mg | ORAL_TABLET | Freq: Every day | ORAL | Status: DC
  Administered 2017-03-12 – 2017-03-13 (×2): 2.5 mg via ORAL
  Filled 2017-03-12 (×2): qty 1

## 2017-03-12 MED ORDER — CLOPIDOGREL BISULFATE 75 MG PO TABS
75.0000 mg | ORAL_TABLET | Freq: Every day | ORAL | Status: DC
  Administered 2017-03-13: 75 mg via ORAL
  Filled 2017-03-12: qty 1

## 2017-03-12 MED ORDER — MIDAZOLAM HCL 2 MG/2ML IJ SOLN
INTRAMUSCULAR | Status: DC | PRN
  Administered 2017-03-12: 1 mg via INTRAVENOUS

## 2017-03-12 MED ORDER — NITROGLYCERIN 5 MG/ML IV SOLN
INTRAVENOUS | Status: AC
  Filled 2017-03-12: qty 10

## 2017-03-12 MED ORDER — BIVALIRUDIN TRIFLUOROACETATE 250 MG IV SOLR
INTRAVENOUS | Status: AC
  Filled 2017-03-12: qty 250

## 2017-03-12 MED ORDER — SODIUM CHLORIDE 0.9% FLUSH
3.0000 mL | INTRAVENOUS | Status: DC | PRN
  Administered 2017-03-12: 3 mL via INTRAVENOUS
  Filled 2017-03-12: qty 3

## 2017-03-12 MED ORDER — SODIUM CHLORIDE 0.9 % IV SOLN
INTRAVENOUS | Status: AC | PRN
  Administered 2017-03-12: 1.75 mg/kg/h via INTRAVENOUS

## 2017-03-12 MED ORDER — SODIUM CHLORIDE 0.9 % IV SOLN: 250.0000 mL | INTRAVENOUS | Status: DC | PRN

## 2017-03-12 MED ORDER — ASPIRIN 81 MG PO CHEW
CHEWABLE_TABLET | ORAL | Status: AC
  Filled 2017-03-12: qty 3

## 2017-03-12 MED ORDER — LEVOTHYROXINE SODIUM 50 MCG PO TABS
75.0000 ug | ORAL_TABLET | Freq: Every day | ORAL | Status: DC
  Administered 2017-03-13: 75 ug via ORAL
  Filled 2017-03-12 (×2): qty 1

## 2017-03-12 MED ORDER — SODIUM CHLORIDE 0.9% FLUSH
3.0000 mL | Freq: Two times a day (BID) | INTRAVENOUS | Status: DC
  Administered 2017-03-12: 3 mL via INTRAVENOUS

## 2017-03-12 MED ORDER — ASPIRIN 81 MG PO CHEW
CHEWABLE_TABLET | ORAL | Status: DC | PRN
  Administered 2017-03-12: 223 mg via ORAL

## 2017-03-12 MED ORDER — ROSUVASTATIN CALCIUM 5 MG PO TABS
5.0000 mg | ORAL_TABLET | Freq: Every day | ORAL | Status: DC
  Administered 2017-03-12: 5 mg via ORAL
  Filled 2017-03-12 (×2): qty 1

## 2017-03-12 MED ORDER — LABETALOL HCL 5 MG/ML IV SOLN: 10.0000 mg | INTRAVENOUS | Status: AC | PRN

## 2017-03-12 MED ORDER — ACETAMINOPHEN 325 MG PO TABS: 650.0000 mg | ORAL_TABLET | ORAL | Status: DC | PRN

## 2017-03-12 MED ORDER — ONDANSETRON HCL 4 MG/2ML IJ SOLN: 4.0000 mg | Freq: Four times a day (QID) | INTRAMUSCULAR | Status: DC | PRN

## 2017-03-12 MED ORDER — IOPAMIDOL (ISOVUE-300) INJECTION 61%
INTRAVENOUS | Status: DC | PRN
  Administered 2017-03-12: 80 mL via INTRA_ARTERIAL

## 2017-03-12 MED ORDER — SODIUM CHLORIDE 0.9 % WEIGHT BASED INFUSION: 1.0000 mL/kg/h | INTRAVENOUS | Status: DC

## 2017-03-12 MED ORDER — ASPIRIN 81 MG PO CHEW
81.0000 mg | CHEWABLE_TABLET | Freq: Every day | ORAL | Status: DC
  Administered 2017-03-13: 81 mg via ORAL
  Filled 2017-03-12: qty 1

## 2017-03-12 MED ORDER — FENTANYL CITRATE (PF) 100 MCG/2ML IJ SOLN
INTRAMUSCULAR | Status: DC | PRN
  Administered 2017-03-12: 25 ug via INTRAVENOUS

## 2017-03-12 MED ORDER — HEPARIN (PORCINE) IN NACL 2-0.9 UNIT/ML-% IJ SOLN
INTRAMUSCULAR | Status: AC
  Filled 2017-03-12: qty 500

## 2017-03-12 MED ORDER — BIVALIRUDIN BOLUS VIA INFUSION - CUPID
INTRAVENOUS | Status: DC | PRN
Start: 2017-03-12 — End: 2017-03-12
  Administered 2017-03-12: 62.93625 mg via INTRAVENOUS

## 2017-03-12 MED ORDER — SODIUM CHLORIDE 0.9% FLUSH: 3.0000 mL | INTRAVENOUS | Status: DC | PRN

## 2017-03-12 MED ORDER — FENTANYL CITRATE (PF) 100 MCG/2ML IJ SOLN
INTRAMUSCULAR | Status: AC
  Filled 2017-03-12: qty 2

## 2017-03-12 MED ORDER — HYDRALAZINE HCL 20 MG/ML IJ SOLN: 5.0000 mg | INTRAMUSCULAR | Status: AC | PRN

## 2017-03-12 MED ORDER — CLOPIDOGREL BISULFATE 75 MG PO TABS
ORAL_TABLET | ORAL | Status: AC
  Filled 2017-03-12: qty 8

## 2017-03-12 MED ORDER — IOPAMIDOL (ISOVUE-300) INJECTION 61%
INTRAVENOUS | Status: DC | PRN
  Administered 2017-03-12: 85 mL via INTRA_ARTERIAL

## 2017-03-12 MED ORDER — NITROGLYCERIN 1 MG/10 ML FOR IR/CATH LAB
INTRA_ARTERIAL | Status: DC | PRN
  Administered 2017-03-12: 200 ug via INTRACORONARY

## 2017-03-12 MED ORDER — CLOPIDOGREL BISULFATE 75 MG PO TABS
ORAL_TABLET | ORAL | Status: DC | PRN
  Administered 2017-03-12: 600 mg via ORAL

## 2017-03-12 MED ORDER — MIDAZOLAM HCL 2 MG/2ML IJ SOLN
INTRAMUSCULAR | Status: AC
Start: 2017-03-12 — End: ?
  Filled 2017-03-12: qty 2

## 2017-03-12 SURGICAL SUPPLY — 19 items
BALLN TREK RX 2.25X12 (BALLOONS) ×4
BALLOON TREK RX 2.25X12 (BALLOONS) IMPLANT
CATH INFINITI 5 FR 3DRC (CATHETERS) ×2 IMPLANT
CATH INFINITI 5FR ANG PIGTAIL (CATHETERS) ×3 IMPLANT
CATH INFINITI 5FR JL4 (CATHETERS) ×2 IMPLANT
CATH INFINITI JR4 5F (CATHETERS) ×2 IMPLANT
CATH VISTA GUIDE 6FR 3DRC (CATHETERS) ×2 IMPLANT
CATH VISTA GUIDE 6FR JR4 SH (CATHETERS) ×2 IMPLANT
DEVICE CLOSURE MYNXGRIP 6/7F (Vascular Products) ×2 IMPLANT
DEVICE INFLAT 30 PLUS (MISCELLANEOUS) ×2 IMPLANT
KIT MANI 3VAL PERCEP (MISCELLANEOUS) ×4 IMPLANT
NDL PERC 18GX7CM (NEEDLE) IMPLANT
NEEDLE PERC 18GX7CM (NEEDLE) ×4 IMPLANT
PACK CARDIAC CATH (CUSTOM PROCEDURE TRAY) ×4 IMPLANT
SHEATH AVANTI 5FR X 11CM (SHEATH) ×2 IMPLANT
SHEATH AVANTI 6FR X 11CM (SHEATH) ×3 IMPLANT
STENT SIERRA 2.50 X 18 MM (Permanent Stent) ×2 IMPLANT
WIRE ASAHI PROWATER 180CM (WIRE) ×2 IMPLANT
WIRE GUIDERIGHT .035X150 (WIRE) ×2 IMPLANT

## 2017-03-12 NOTE — Plan of Care (Signed)
  Progressing Education: Understanding of CV disease, CV risk reduction, and recovery process will improve 03/12/2017 2326 - Progressing by Loran Senters, RN Cardiovascular: Ability to achieve and maintain adequate cardiovascular perfusion will improve 03/12/2017 2326 - Progressing by Loran Senters, RN Vascular access site(s) Level 0-1 will be maintained 03/12/2017 2326 - Progressing by Loran Senters, RN

## 2017-03-12 NOTE — H&P (Signed)
Chief Complaint: Chief Complaint  Patient presents with  . Follow-up  echo and myoview  . Hypertension  . Shortness of Breath  on exertion  Date of Service: 02/27/2017 Date of Birth: [birth year] PCP: Azzie Glatter, MD  History of Present Illness: Nichole Sanchez is a 77 y.o.female patient who presents for follow-up visit. She has had new onset chest pain and shortness of breath. This is especially with exertion. It is been present since the fall this past year. She denies rest symptoms. She denies orthopnea or PND syncope or presyncope. She is being treated for hypertension, hyperlipidemia. She underwent a functional study which revealed served LV function with no ischemia. Echocardiogram revealed normal LV function with mild MR mild TR. Patient has improved somewhat.  Past Medical and Surgical History  Past Medical History Past Medical History:  Diagnosis Date  . Asthma without status asthmaticus, unspecified  . Degenerative arthritis of knee, bilateral  . Dermatitis, unspecified  severe. Allergic to propylene glyco  . Elevated blood pressure  . GERD (gastroesophageal reflux disease)  . Hemorrhoids  with banding, 2011  . History of adenomatous polyp of colon 06/18/2016  . Hyperlipidemia, unspecified  . Hypertension  . Hypothyroid, unspecified  . Migraines  . Multiple drug allergies   Past Surgical History She has a past surgical history that includes Appendectomy; Tonsillectomy (1963); Dilation and curettage, diagnostic / therapeutic; miscarriages; Vaginal hysterectomy in 1986, ovaries intact; Oral cyst removed in the 1990s by Dr. Rosana Hoes in [place]; Joint and tendon transplant on right wrist in late 1990s; Knee arthroscopy (Left, 2014); Cholecystectomy (2011); Hemorrhoids banding surgery; Joint replacement (Right); Colonoscopy (04/01/2002); Colonoscopy (02/23/2009); egd (02/23/2009, 04/01/2002, 11/24/1999); and Colonoscopy (09/19/2016).   Medications and Allergies   Current Medications  Current Outpatient Medications  Medication Sig Dispense Refill  . amLODIPine (NORVASC) 2.5 MG tablet Take 1 tablet (2.5 mg total) by mouth once daily 90 tablet 3  . aspirin 81 MG EC tablet Take 81 mg by mouth once daily  . biotin 1 mg Cap Take by mouth.  . candesartan (ATACAND) 32 MG tablet Take 1 tablet (32 mg total) by mouth once daily 90 tablet 3  . cholecalciferol (VITAMIN D3) 2,000 unit tablet Take 2,000 Units by mouth once daily.  . cyanocobalamin (VITAMIN B12) 1000 MCG tablet Take 1,000 mcg by mouth once daily.  Marland Kitchen levothyroxine (SYNTHROID, LEVOTHROID) 75 MCG tablet Take 1 tablet (75 mcg total) by mouth once daily Take on an empty stomach with a glass of water at least 30-60 minutes before breakfast. 90 tablet 3  . montelukast (SINGULAIR) 10 mg tablet Take 1 tablet (10 mg total) by mouth once daily 90 tablet 3  . MULTIVIT WITH CALCIUM,IRON,MIN (MULTIPLE VITAMIN, WOMENS ORAL) Take by mouth.  . omega-3 fatty acids/fish oil 340-1,000 mg capsule Take 1 capsule by mouth 2 (two) times daily.  . rosuvastatin (CRESTOR) 5 MG tablet Take 1 tablet (5 mg total) by mouth once daily 90 tablet 3   No current facility-administered medications for this visit.   Allergies: Dextromethorphan hbr; Advair diskus [fluticasone-salmeterol]; Bacitracin-polymyxin b; Codeine; Codeine sulfate; Cyclobenzaprine; Hydrochlorothiazide; Latex; Lodine [etodolac]; Naproxen; Neomycin-bacitracnzn-polymyxnb; Neosporin [benzalkonium chloride]; Nexium [esomeprazole magnesium]; Nystatin; Percocet [oxycodone-acetaminophen]; Prednisone; Proair hfa [albuterol sulfate]; Propylene glycol; Tussionex pennkinetic er [hydrocodone-chlorpheniramine]; and Tylenol [acetaminophen]  Social and Family History  Social History reports that she quit smoking about 43 years ago. She has never used smokeless tobacco. She reports that she does not drink alcohol.  Family History Family History  Problem Relation Age of Onset  .  Myocardial Infarction (Heart attack) Mother  . Rheum arthritis Mother  . Osteoporosis (Thinning of bones) Mother  . COPD Mother  . Prostate cancer Brother  . Lung cancer Brother   Review of Systems  Review of Systems  Constitutional: Negative for chills, diaphoresis, fever, malaise/fatigue and weight loss.  HENT: Negative for congestion, ear discharge, hearing loss and tinnitus.  Eyes: Negative for blurred vision.  Respiratory: Positive for shortness of breath. Negative for cough, hemoptysis, sputum production and wheezing.  Cardiovascular: Positive for chest pain. Negative for palpitations, orthopnea, claudication, leg swelling and PND.  Gastrointestinal: Negative for abdominal pain, blood in stool, constipation, diarrhea, heartburn, melena, nausea and vomiting.  Genitourinary: Negative for dysuria, frequency, hematuria and urgency.  Musculoskeletal: Negative for back pain, falls, joint pain and myalgias.  Skin: Negative for itching and rash.  Neurological: Negative for dizziness, tingling, focal weakness, loss of consciousness, weakness and headaches.  Endo/Heme/Allergies: Negative for polydipsia. Does not bruise/bleed easily.  Psychiatric/Behavioral: Negative for depression, memory loss and substance abuse. The patient is not nervous/anxious.   Physical Examination   Vitals:BP 160/84  Pulse 80  Resp 12  Ht 157.5 cm (5\' 2" )  Wt 87.1 kg (192 lb)  LMP (LMP Unknown)  BMI 35.12 kg/m  Ht:157.5 cm (5\' 2" ) Wt:87.1 kg (192 lb) YTK:ZSWF surface area is 1.95 meters squared. Body mass index is 35.12 kg/m.  Wt Readings from Last 3 Encounters:  02/27/17 87.1 kg (192 lb)  02/07/17 87.5 kg (193 lb)  02/04/17 88.8 kg (195 lb 12.8 oz)   BP Readings from Last 3 Encounters:  02/27/17 160/84  02/07/17 (!) 160/92  02/04/17 162/88   General appearance appears in no acute distress  Head Mouth and Eye exam Normocephalic, without obvious abnormality, atraumatic Dentition is good Eyes  appear anicteric   Neck exam Thyroid: normal  Nodes: no obvious adenopathy  LUNGS Breath Sounds: Normal Percussion: Normal  CARDIOVASCULAR JVP CV wave: no HJR: no Elevation at 90 degrees: None Carotid Pulse: normal pulsation bilaterally Bruit: None Apex: apical impulse normal  Auscultation Rhythm: normal sinus rhythm S1: normal S2: normal Clicks: no Rub: no Murmurs: no murmurs  Gallop: None ABDOMEN Liver enlargement: no Pulsatile aorta: no Ascites: no Bruits: no  EXTREMITIES Clubbing: no Edema: 1+ bilateral pedal edema Pulses: peripheral pulses symmetrical Femoral Bruits: no Amputation: no SKIN Rash: no Cyanosis: no Embolic phemonenon: no Bruising: no NEURO Alert and Oriented to person, place and time: yes Non focal: yes  PSYCH: Pt appears to have normal affect  LABS REVIEWED Last 3 CBC results: Lab Results  Component Value Date  WBC 6.8 02/27/2017  WBC 6.1 01/28/2017  WBC 6.2 01/26/2016   Lab Results  Component Value Date  HGB 14.2 02/27/2017  HGB 14.3 01/28/2017  HGB 15.2 (H) 01/26/2016   Lab Results  Component Value Date  HCT 41.4 02/27/2017  HCT 41.2 01/28/2017  HCT 43.3 01/26/2016   Lab Results  Component Value Date  PLT 239 02/27/2017  PLT 216 01/28/2017  PLT 234 01/26/2016   Lab Results  Component Value Date  CREATININE 0.8 02/27/2017  BUN 15 02/27/2017  NA 138 02/27/2017  K 5.1 02/27/2017  CL 103 02/27/2017  CO2 26.6 02/27/2017   No results found for: HGBA1C  Lab Results  Component Value Date  HDL 36.3 01/28/2017  HDL 41.7 01/26/2016  HDL 36.2 01/25/2015   No results found for: [facility] Lab Results  Component Value Date  TRIG 417 (H) 01/28/2017  TRIG 449 (H) 01/26/2016  TRIG 510 (H)  01/25/2015   Lab Results  Component Value Date  ALT 24 01/28/2017  AST 23 01/28/2017  ALKPHOS 85 01/28/2017   Lab Results  Component Value Date  TSH 4.358 01/28/2017   Diagnostic Studies Reviewed:  EKG EKG  demonstrated normal sinus rhythm, nonspecific ST and T waves changes.  Assessment and Plan   77 y.o. female with  ICD-10-CM ICD-9-CM  1. SOB (shortness of breath)-symptoms do not appear to be secondary to significant structural valvular disease. We will continue to follow for any increase. Maintaining ideal body weight and daily activity is recommended. R06.02 786.05 Echo complete  2. Chest pain syndrome, unspecified-etiology of chest pain is unclear. External study showed no evidence of ischemia. Would recommend continued risk factor modification including maintaining an LDL less than 100 and average blood pressure 130/80 and avoiding echo products. Her symptoms increase consideration for invasive or further imaging modalities should be considered. R07.9 786.50 NM myocardial perfusion SPECT multiple (stress and rest)  ECG stress test only  3. Mixed hyperlipidemia-we will continue with rosuvastatin at 5 mg daily with an LDL goal of less than 100 preferably near 70. E78.2 272.2  4. Essential hypertension-we will increase Atacand to 32 mg daily and amlodipine 5 mg daily and follow blood pressure remains somewhat elevated. Diet is recommended. Blood pressure goal of 130/80 or less average. I10 401.9   Return in about 3 weeks (around 03/20/2017).  These notes generated with voice recognition software. I apologize for typographical errors.  Sydnee Levans, MD     Pt seen and examined. No change from above.

## 2017-03-13 DIAGNOSIS — I2510 Atherosclerotic heart disease of native coronary artery without angina pectoris: Secondary | ICD-10-CM | POA: Diagnosis not present

## 2017-03-13 LAB — BASIC METABOLIC PANEL
Anion gap: 6 (ref 5–15)
BUN: 18 mg/dL (ref 6–20)
CO2: 24 mmol/L (ref 22–32)
Calcium: 9 mg/dL (ref 8.9–10.3)
Chloride: 107 mmol/L (ref 101–111)
Creatinine, Ser: 0.7 mg/dL (ref 0.44–1.00)
GFR calc Af Amer: >60 mL/min (ref >60)
GFR calc non Af Amer: >60 mL/min (ref >60)
Glucose, Bld: 102 mg/dL — ABNORMAL HIGH (ref 65–99)
Potassium: 4.4 mmol/L (ref 3.5–5.1)
Sodium: 137 mmol/L (ref 135–145)

## 2017-03-13 LAB — CBC
HCT: 37.8 % (ref 35.0–47.0)
Hemoglobin: 13.2 g/dL (ref 12.0–16.0)
MCH: 31.3 pg (ref 26.0–34.0)
MCHC: 35 g/dL (ref 32.0–36.0)
MCV: 89.6 fL (ref 80.0–100.0)
Platelets: 187 10*3/uL (ref 150–440)
RBC: 4.22 MIL/uL (ref 3.80–5.20)
RDW: 13 % (ref 11.5–14.5)
WBC: 6.5 10*3/uL (ref 3.6–11.0)

## 2017-03-13 MED ORDER — CLOPIDOGREL BISULFATE 75 MG PO TABS: 75.0000 mg | ORAL_TABLET | Freq: Every day | ORAL | 11 refills | Status: DC

## 2017-03-13 NOTE — Care Management Obs Status (Signed)
[facility] NOTIFICATION   Patient Details  Name: SHAKURA COWING MRN: [id number] Date of Birth: 08/30/1940   Medicare Observation Status Notification Given:  No  Discharge order placed in < 24hr of being placed on observation  Katrina Stack, RN 03/13/2017, 9:21 AM

## 2017-03-13 NOTE — Progress Notes (Signed)
77 year old female who presented for outpatient cath on 03/13/2017.  Patient had been having exertional chest pain and SOB.  Patient found to have 99% blockage of mid RCA.  Patient underwent PCI with DES to mid RCA.    Reviewed definition of CAD and what that means. ?Risk factors of heart disease reviewed with patient. Explained the importance of controlling BP, cholesterol, and blood sugar; maintaining a healthy weight; smoking cessation - if applicable, and exercise. Informed patient the nurse will provide her with the stent card and instructed her to keep with her at all times.   "Angioplasty and Stents" educational book given and reviewed with patient.   Reviewed cardiac medications, rationale for taking, and side effects. Patient will be taking ASA and Plavix.  Patient was already on Amlodipine.    Discussed the importance of following a low sodium, low fat, low cholesterol heart healthy diet.?  Patient is a former smoker.   ?? Exercise discussed.  Patient informed this RN that she was using her stationary bike 30 minutes every day before she started becoming so SOB.  Patient plans to resume using her stationary bike every day.  In addition, patient has a pool, which she uses in the summer time.  Discussed Cardiac Rehab with patient.  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  Patient cannot leave him alone.  Patient declines to participate in Cardiac Rehab.   Patient will follow-up with Dr. Ubaldo Glassing.    Patient thanked me for my time.  ??   Roanna Epley, RN, BSN, [place]  [facility] Cardiac & Pulmonary Rehab  Cardiovascular & Pulmonary Nurse Navigator  Direct Line: [phone]  Department Phone #: [phone] Fax: [phone]  Email Address: [email or web address]

## 2017-03-13 NOTE — Final Progress Note (Signed)
Physician Final Progress Note  Patient ID: Nichole Sanchez MRN: [id number] DOB/AGE: Apr 10, 1940 77 y.o.  Admit date: 03/12/2017 Admitting provider: Isaias Cowman, MD Discharge date: 03/13/2017   Admission Diagnoses: cad  Discharge Diagnoses:  Active Problems:   CAD (coronary artery disease)  HTN Hyperlipidemia  Consults: None  Significant Findings/ Diagnostic Studies: angiography: Left coronary cath  Procedures: PCI of RCA  Discharge Condition: good  Disposition: 01-Home or Self Care  Diet: Low fat, Low cholesterol diet  Discharge Activity: No lifting, driving, or strenuous exercise for today and tomorrow  Discharge Instructions    AMB Referral to Cardiac Rehabilitation - Phase II   Complete by:  As directed    Diagnosis:  Coronary Stents     Allergies as of 03/13/2017      Reactions   Tussin Cough [dextromethorphan Hbr] Rash, Other (See Comments)   Propylene glychol   Advair Diskus [fluticasone-salmeterol] Other (See Comments)   blisters   Albuterol Sulfate Swelling   Benzalkonium Chloride Other (See Comments)   Unknown   Cyclobenzaprine Other (See Comments)   Unknown   Etodolac Other (See Comments)   palpitations   Hctz [hydrochlorothiazide] Other (See Comments)   blisters   Neosporin [neomycin-bacitracin Zn-polymyx] Other (See Comments)   blisters   Prednisone Other (See Comments)   Can not take in large doses   Proair Hfa [albuterol] Swelling   Tylenol [acetaminophen] Swelling, Other (See Comments)   Cannot take with any other medications   Codeine Nausea And Vomiting, Rash   Esomeprazole Rash, Other (See Comments)   Writing on pill has propylene glychol   Latex Rash   Naproxen Palpitations   Nystatin Rash   Oxycodone-acetaminophen Rash   Polysporin [bacitracin-polymyxin B] Rash   Propylene Glycol Rash, Other (See Comments)   blisters   Tape Rash   Tussionex Pennkinetic Er [hydrocod Polst-cpm Polst Er] Rash      Medication List     STOP taking these medications   gabapentin 400 MG capsule Commonly known as:  NEURONTIN   traMADol 50 MG tablet Commonly known as:  ULTRAM     TAKE these medications   ALLERGY RELIEF 10 MG tablet Generic drug:  loratadine Take 10 mg by mouth daily as needed for allergies.   amLODipine 2.5 MG tablet Commonly known as:  NORVASC Take 2.5 mg by mouth at bedtime.   aspirin EC 81 MG tablet Take 81 mg by mouth daily.   Biotin 1 MG Caps Take 1 mg by mouth daily.   candesartan 16 MG tablet Commonly known as:  ATACAND Take 32 mg by mouth daily.   clopidogrel 75 MG tablet Commonly known as:  PLAVIX Take 1 tablet (75 mg total) by mouth daily with breakfast.   EQ COMPLETE MULTIVIT ADULT 50+ PO Take 1 tablet by mouth daily.   Fish Oil 1000 MG Caps Take 1,000 mg by mouth 2 (two) times daily.   levothyroxine 75 MCG tablet Commonly known as:  SYNTHROID, LEVOTHROID Take 75 mcg by mouth daily before breakfast.   montelukast 10 MG tablet Commonly known as:  SINGULAIR Take 10 mg by mouth at bedtime.   rosuvastatin 5 MG tablet Commonly known as:  CRESTOR Take 5 mg by mouth daily.   SUPER B COMPLEX PO Take 1 tablet by mouth daily with lunch.   TYLENOL 500 MG tablet Generic drug:  acetaminophen Take 1,000 mg by mouth every 4 (four) hours as needed for moderate pain or headache.   VITAMIN B-12 PO Take 1  tablet by mouth daily.   Vitamin D 2000 units Caps Take 2,000 Units by mouth daily.   VITAMIN E PO Take 1 capsule by mouth daily.      Follow-up Information    Teodoro Spray, MD Follow up in 1 week(s).   Specialty:  Cardiology Contact information: [place] [phone]           Total time spent taking care of this patient: 30 minutes  Signed: Teodoro Spray 03/13/2017, 7:58 AM

## 2017-03-13 NOTE — Discharge Summary (Signed)
Physician Discharge Summary  Patient ID: Nichole Sanchez MRN: [id number] DOB/AGE: 1940-09-26 77 y.o.  Admit date: 03/12/2017 Discharge date: 03/13/2017  Admission Diagnoses:  Discharge Diagnoses:  Active Problems:   CAD (coronary artery disease)   Discharged Condition: good  Hospital Course: 77 yo female with exertional chest pain and sob who underwent cardiac cath as outpatient and was oted to have a 99% mid rca. Underwent pci with des with excellent result. Has done well post pci.   Consults: None  Significant Diagnostic Studies: cardiac cath  Treatments: IV hydration  Discharge Exam: Blood pressure (!) 129/49, pulse 71, temperature 97.9 F (36.6 C), resp. rate 18, height 5\' 2"  (1.575 m), weight 88 kg (193 lb 14.4 oz), SpO2 97 %. General appearance: alert and cooperative Head: Normocephalic, without obvious abnormality, atraumatic Resp: clear to auscultation bilaterally Cardio: regular rate and rhythm GI: soft, non-tender; bowel sounds normal; no masses,  no organomegaly Extremities: extremities normal, atraumatic, no cyanosis or edema Neurologic: Grossly normal  Disposition: 01-Home or Self Care  Discharge Instructions    AMB Referral to Cardiac Rehabilitation - Phase II   Complete by:  As directed    Diagnosis:  Coronary Stents     Allergies as of 03/13/2017      Reactions   Tussin Cough [dextromethorphan Hbr] Rash, Other (See Comments)   Propylene glychol   Advair Diskus [fluticasone-salmeterol] Other (See Comments)   blisters   Albuterol Sulfate Swelling   Benzalkonium Chloride Other (See Comments)   Unknown   Cyclobenzaprine Other (See Comments)   Unknown   Etodolac Other (See Comments)   palpitations   Hctz [hydrochlorothiazide] Other (See Comments)   blisters   Neosporin [neomycin-bacitracin Zn-polymyx] Other (See Comments)   blisters   Prednisone Other (See Comments)   Can not take in large doses   Proair Hfa [albuterol] Swelling   Tylenol  [acetaminophen] Swelling, Other (See Comments)   Cannot take with any other medications   Codeine Nausea And Vomiting, Rash   Esomeprazole Rash, Other (See Comments)   Writing on pill has propylene glychol   Latex Rash   Naproxen Palpitations   Nystatin Rash   Oxycodone-acetaminophen Rash   Polysporin [bacitracin-polymyxin B] Rash   Propylene Glycol Rash, Other (See Comments)   blisters   Tape Rash   Tussionex Pennkinetic Er [hydrocod Polst-cpm Polst Er] Rash      Medication List    STOP taking these medications   gabapentin 400 MG capsule Commonly known as:  NEURONTIN   traMADol 50 MG tablet Commonly known as:  ULTRAM     TAKE these medications   ALLERGY RELIEF 10 MG tablet Generic drug:  loratadine Take 10 mg by mouth daily as needed for allergies.   amLODipine 2.5 MG tablet Commonly known as:  NORVASC Take 2.5 mg by mouth at bedtime.   aspirin EC 81 MG tablet Take 81 mg by mouth daily.   Biotin 1 MG Caps Take 1 mg by mouth daily.   candesartan 16 MG tablet Commonly known as:  ATACAND Take 32 mg by mouth daily.   clopidogrel 75 MG tablet Commonly known as:  PLAVIX Take 1 tablet (75 mg total) by mouth daily with breakfast.   EQ COMPLETE MULTIVIT ADULT 50+ PO Take 1 tablet by mouth daily.   Fish Oil 1000 MG Caps Take 1,000 mg by mouth 2 (two) times daily.   levothyroxine 75 MCG tablet Commonly known as:  SYNTHROID, LEVOTHROID Take 75 mcg by mouth daily before breakfast.  montelukast 10 MG tablet Commonly known as:  SINGULAIR Take 10 mg by mouth at bedtime.   rosuvastatin 5 MG tablet Commonly known as:  CRESTOR Take 5 mg by mouth daily.   SUPER B COMPLEX PO Take 1 tablet by mouth daily with lunch.   TYLENOL 500 MG tablet Generic drug:  acetaminophen Take 1,000 mg by mouth every 4 (four) hours as needed for moderate pain or headache.   VITAMIN B-12 PO Take 1 tablet by mouth daily.   Vitamin D 2000 units Caps Take 2,000 Units by mouth  daily.   VITAMIN E PO Take 1 capsule by mouth daily.      Follow-up Information    Teodoro Spray, MD Follow up in 1 week(s).   Specialty:  Cardiology Contact information: [place] [address] [phone]           Signed: Teodoro Spray 03/13/2017, 7:56 AM

## 2019-02-26 ENCOUNTER — Ambulatory Visit: Payer: Medicare Other

## 2019-02-26 ENCOUNTER — Ambulatory Visit: Payer: Medicare Other | Attending: Internal Medicine

## 2019-02-26 DIAGNOSIS — Z23 Encounter for immunization: Secondary | ICD-10-CM

## 2019-02-26 NOTE — Progress Notes (Signed)
   Covid-19 Vaccination Clinic  Name:  Nichole Sanchez    MRN: [id number] DOB: 14-Jun-1940  02/26/2019  Nichole Sanchez was observed post Covid-19 immunization for 15 minutes without incidence. She was provided with Vaccine Information Sheet and instruction to access the V-Safe system.   Nichole Sanchez was instructed to call 911 with any severe reactions post vaccine: Marland Kitchen Difficulty breathing  . Swelling of your face and throat  . A fast heartbeat  . A bad rash all over your body  . Dizziness and weakness    Immunizations Administered    Name Date Dose VIS Date Route   Pfizer COVID-19 Vaccine 02/26/2019 11:09 AM 0.3 mL 12/12/2018 Intramuscular   Manufacturer: [place]   Lot: [id number]   [place]: [id number]

## 2019-03-24 ENCOUNTER — Ambulatory Visit: Payer: Medicare Other | Attending: Internal Medicine

## 2019-03-24 ENCOUNTER — Ambulatory Visit: Payer: Medicare Other

## 2019-03-24 DIAGNOSIS — Z23 Encounter for immunization: Secondary | ICD-10-CM

## 2019-03-24 NOTE — Progress Notes (Signed)
   Covid-19 Vaccination Clinic  Name:  Nichole Sanchez    MRN: [id number] DOB: May 11, 1940  03/24/2019  Nichole Sanchez was observed post Covid-19 immunization for 15 minutes without incident. She was provided with Vaccine Information Sheet and instruction to access the V-Safe system.   Nichole Sanchez was instructed to call 911 with any severe reactions post vaccine: Marland Kitchen Difficulty breathing  . Swelling of face and throat  . A fast heartbeat  . A bad rash all over body  . Dizziness and weakness   Immunizations Administered    Name Date Dose VIS Date Route   Pfizer COVID-19 Vaccine 03/24/2019 11:24 AM 0.3 mL 12/12/2018 Intramuscular   Manufacturer: [place]   Lot: [id number]   [place]: [id number]

## 2019-05-21 ENCOUNTER — Other Ambulatory Visit: Payer: Self-pay | Admitting: Internal Medicine

## 2019-05-22 ENCOUNTER — Other Ambulatory Visit: Payer: Self-pay | Admitting: Internal Medicine

## 2019-05-22 DIAGNOSIS — G8929 Other chronic pain: Secondary | ICD-10-CM

## 2019-05-22 DIAGNOSIS — R109 Unspecified abdominal pain: Secondary | ICD-10-CM

## 2019-05-26 ENCOUNTER — Other Ambulatory Visit: Payer: Self-pay

## 2019-05-26 ENCOUNTER — Ambulatory Visit
Admission: RE | Admit: 2019-05-26 | Discharge: 2019-05-26 | Disposition: A | Discharge status: Home / Self Care | Payer: Medicare Other | Source: Ambulatory Visit | Attending: Internal Medicine | Admitting: Internal Medicine

## 2019-05-26 DIAGNOSIS — R109 Unspecified abdominal pain: Secondary | ICD-10-CM | POA: Diagnosis not present

## 2019-05-26 MED ORDER — IOHEXOL 300 MG/ML  SOLN
100.0000 mL | Freq: Once | INTRAMUSCULAR | Status: AC | PRN
  Administered 2019-05-26: 100 mL via INTRAVENOUS

## 2019-07-24 ENCOUNTER — Other Ambulatory Visit: Payer: Self-pay

## 2019-07-24 ENCOUNTER — Other Ambulatory Visit
Admission: RE | Admit: 2019-07-24 | Discharge: 2019-07-24 | Disposition: A | Discharge status: Home / Self Care | Payer: Medicare Other | Source: Ambulatory Visit | Attending: Gastroenterology | Admitting: Gastroenterology

## 2019-07-24 DIAGNOSIS — Z01812 Encounter for preprocedural laboratory examination: Secondary | ICD-10-CM | POA: Insufficient documentation

## 2019-07-24 DIAGNOSIS — Z20822 Contact with and (suspected) exposure to covid-19: Secondary | ICD-10-CM | POA: Diagnosis not present

## 2019-07-25 LAB — SARS CORONAVIRUS 2 (TAT 6-24 HRS): SARS Coronavirus 2: NEGATIVE

## 2019-07-28 ENCOUNTER — Encounter
Admission: RE | Disposition: A | Discharge status: Home / Self Care | Payer: Self-pay | Source: Home / Self Care | Attending: Gastroenterology

## 2019-07-28 ENCOUNTER — Ambulatory Visit
Admission: RE | Admit: 2019-07-28 | Discharge: 2019-07-28 | Disposition: A | Discharge status: Home / Self Care | Payer: Medicare Other | Attending: Gastroenterology | Admitting: Gastroenterology

## 2019-07-28 ENCOUNTER — Other Ambulatory Visit: Payer: Self-pay

## 2019-07-28 ENCOUNTER — Encounter: Payer: Self-pay | Admitting: Anesthesiology

## 2019-07-28 ENCOUNTER — Ambulatory Visit: Payer: Medicare Other | Admitting: Anesthesiology

## 2019-07-28 DIAGNOSIS — K2970 Gastritis, unspecified, without bleeding: Secondary | ICD-10-CM | POA: Insufficient documentation

## 2019-07-28 DIAGNOSIS — E039 Hypothyroidism, unspecified: Secondary | ICD-10-CM | POA: Diagnosis not present

## 2019-07-28 DIAGNOSIS — M1990 Unspecified osteoarthritis, unspecified site: Secondary | ICD-10-CM | POA: Insufficient documentation

## 2019-07-28 DIAGNOSIS — I2510 Atherosclerotic heart disease of native coronary artery without angina pectoris: Secondary | ICD-10-CM | POA: Insufficient documentation

## 2019-07-28 DIAGNOSIS — E785 Hyperlipidemia, unspecified: Secondary | ICD-10-CM | POA: Insufficient documentation

## 2019-07-28 DIAGNOSIS — R1013 Epigastric pain: Secondary | ICD-10-CM | POA: Insufficient documentation

## 2019-07-28 DIAGNOSIS — K219 Gastro-esophageal reflux disease without esophagitis: Secondary | ICD-10-CM | POA: Insufficient documentation

## 2019-07-28 DIAGNOSIS — Z87891 Personal history of nicotine dependence: Secondary | ICD-10-CM | POA: Insufficient documentation

## 2019-07-28 DIAGNOSIS — J45909 Unspecified asthma, uncomplicated: Secondary | ICD-10-CM | POA: Insufficient documentation

## 2019-07-28 DIAGNOSIS — Z86010 Personal history of colonic polyps: Secondary | ICD-10-CM | POA: Diagnosis not present

## 2019-07-28 DIAGNOSIS — I10 Essential (primary) hypertension: Secondary | ICD-10-CM | POA: Insufficient documentation

## 2019-07-28 DIAGNOSIS — K5790 Diverticulosis of intestine, part unspecified, without perforation or abscess without bleeding: Secondary | ICD-10-CM | POA: Diagnosis not present

## 2019-07-28 HISTORY — PX: ESOPHAGOGASTRODUODENOSCOPY (EGD) WITH PROPOFOL: SHX5813

## 2019-07-28 SURGERY — ESOPHAGOGASTRODUODENOSCOPY (EGD) WITH PROPOFOL
Anesthesia: General

## 2019-07-28 MED ORDER — PROPOFOL 500 MG/50ML IV EMUL
INTRAVENOUS | Status: DC | PRN
  Administered 2019-07-28: 120 ug/kg/min via INTRAVENOUS

## 2019-07-28 MED ORDER — BUTAMBEN-TETRACAINE-BENZOCAINE 2-2-14 % EX AERO
INHALATION_SPRAY | CUTANEOUS | Status: DC | PRN
  Administered 2019-07-28: 2 via TOPICAL

## 2019-07-28 MED ORDER — SODIUM CHLORIDE 0.9 % IV SOLN: INTRAVENOUS | Status: DC

## 2019-07-28 NOTE — Anesthesia Postprocedure Evaluation (Signed)
Anesthesia Post Note  Patient: Nichole Sanchez  Procedure(s) Performed: ESOPHAGOGASTRODUODENOSCOPY (EGD) WITH PROPOFOL (N/A )  Patient location during evaluation: PACU Anesthesia Type: General Level of consciousness: awake and alert Pain management: pain level controlled Vital Signs Assessment: post-procedure vital signs reviewed and stable Respiratory status: spontaneous breathing and respiratory function stable Cardiovascular status: stable Anesthetic complications: no   No complications documented.   Last Vitals:  Vitals:   07/28/19 0927 07/28/19 1017  BP: (!) 138/86 (!) 118/53  Pulse: 86   Temp: (!) 36.3 C (!) 36.1 C  SpO2: 97%     Last Pain:  Vitals:   07/28/19 1037  TempSrc:   PainSc: 0-No pain                 [name] K

## 2019-07-28 NOTE — Transfer of Care (Signed)
Immediate Anesthesia Transfer of Care Note  Patient: Nichole Sanchez  Procedure(s) Performed: ESOPHAGOGASTRODUODENOSCOPY (EGD) WITH PROPOFOL (N/A )  Patient Location: PACU  Anesthesia Type:General  Level of Consciousness: awake and sedated  Airway & Oxygen Therapy: Patient Spontanous Breathing and Patient connected to nasal cannula oxygen  Post-op Assessment: Report given to RN and Post -op Vital signs reviewed and stable  Post vital signs: Reviewed and stable  Last Vitals:  Vitals Value Taken Time  BP 118/53 07/28/19 1017  Temp 36.1 C 07/28/19 1017  Pulse 68 07/28/19 1021  Resp 16 07/28/19 1021  SpO2 96 % 07/28/19 1021  Vitals shown include unvalidated device data.  Last Pain:  Vitals:   07/28/19 1017  TempSrc: Temporal  PainSc: Asleep         Complications: No complications documented.

## 2019-07-28 NOTE — Anesthesia Procedure Notes (Signed)
Performed by: Cook-Martin, Olan Kurek Pre-anesthesia Checklist: Patient identified, Emergency Drugs available, Suction available, Patient being monitored and Timeout performed Patient Re-evaluated:Patient Re-evaluated prior to induction Oxygen Delivery Method: Nasal cannula Preoxygenation: Pre-oxygenation with 100% oxygen Induction Type: IV induction Airway Equipment and Method: Bite block Placement Confirmation: CO2 detector and positive ETCO2       

## 2019-07-28 NOTE — H&P (Signed)
Outpatient short stay form Pre-procedure 07/28/2019 9:58 AM Raylene Miyamoto MD, MPH  Primary Physician: Dr. Ginette Pitman  Reason for visit:  Dyspepsia  History of present illness:  79 y/o lady with dyspeptic symptoms (nausea, abdominal pain,  Heart burn) for months. No blood thinners, no family history of GI malignancies.    Current Facility-Administered Medications:    0.9 %  sodium chloride infusion, , Intravenous, Continuous, Hrithik Boschee, Hilton Cork, MD, Last Rate: 20 mL/hr at 07/28/19 0948, New Bag at 07/28/19 0948  Medications Prior to Admission  Medication Sig Dispense Refill Last Dose   aspirin EC 81 MG tablet Take 81 mg by mouth daily.   07/23/2019 at Unknown time   candesartan (ATACAND) 16 MG tablet Take 32 mg by mouth daily.   07/28/2019 at 0545time   levothyroxine (SYNTHROID, LEVOTHROID) 75 MCG tablet Take 75 mcg by mouth daily before breakfast.   07/28/2019 at 0545time   montelukast (SINGULAIR) 10 MG tablet Take 10 mg by mouth at bedtime.   07/28/2019 at 0545time   acetaminophen (TYLENOL) 500 MG tablet Take 1,000 mg by mouth every 4 (four) hours as needed for moderate pain or headache.       amLODipine (NORVASC) 2.5 MG tablet Take 2.5 mg by mouth at bedtime.       B Complex-C (SUPER B COMPLEX PO) Take 1 tablet by mouth daily with lunch.      Biotin 1 MG CAPS Take 1 mg by mouth daily.       Cholecalciferol (VITAMIN D) 2000 units CAPS Take 2,000 Units by mouth daily.       clopidogrel (PLAVIX) 75 MG tablet Take 1 tablet (75 mg total) by mouth daily with breakfast. 30 tablet 11    Cyanocobalamin (VITAMIN B-12 PO) Take 1 tablet by mouth daily.      loratadine (ALLERGY RELIEF) 10 MG tablet Take 10 mg by mouth daily as needed for allergies.      Multiple Vitamins-Minerals (EQ COMPLETE MULTIVIT ADULT 50+ PO) Take 1 tablet by mouth daily.      Omega-3 Fatty Acids (FISH OIL) 1000 MG CAPS Take 1,000 mg by mouth 2 (two) times daily.       rosuvastatin (CRESTOR) 5 MG tablet Take 5 mg  by mouth daily.      VITAMIN E PO Take 1 capsule by mouth daily.        Allergies  Allergen Reactions   Tussin Cough [Dextromethorphan Hbr] Rash and Other (See Comments)    Propylene glychol   Advair Diskus [Fluticasone-Salmeterol] Other (See Comments)    blisters   Albuterol Sulfate Swelling   Benzalkonium Chloride Other (See Comments)    Unknown   Cyclobenzaprine Other (See Comments)    Unknown   Etodolac Other (See Comments)    palpitations   Hctz [Hydrochlorothiazide] Other (See Comments)    blisters   Neosporin [Neomycin-Bacitracin Zn-Polymyx] Other (See Comments)    blisters   Prednisone Other (See Comments)    Can not take in large doses   Proair Hfa [Albuterol] Swelling   Tylenol [Acetaminophen] Swelling and Other (See Comments)    Cannot take with any other medications   Codeine Nausea And Vomiting and Rash   Esomeprazole Rash and Other (See Comments)    Writing on pill has propylene glychol   Latex Rash   Naproxen Palpitations   Nystatin Rash   Oxycodone-Acetaminophen Rash   Polysporin [Bacitracin-Polymyxin B] Rash   Propylene Glycol Rash and Other (See Comments)    blisters  Tape Rash   Tussionex Pennkinetic Er [Hydrocod Polst-Cpm Polst Er] Rash     Past Medical History:  Diagnosis Date   Arthritis    Asthma    well controlled-no inhalers   Bronchitis    Carpal tunnel syndrome    Diverticulosis    GERD (gastroesophageal reflux disease)    h/o   Headache    h/o migraines 1980's   Hemorrhoids    History of colon polyps    Hyperlipidemia    Hypertension    Hypothyroidism    Pneumonia    in the past    Review of systems:  Otherwise negative.    Physical Exam  Gen: Alert, oriented. Appears stated age.  HEENT: [place]/AT. PERRLA. Lungs: no respiratory distress CV: RR nl S1, S2. Abd: soft, benign, no masses. BS+ Ext: No edema. Pulses 2+    Planned procedures: Proceed with EGD. The patient understands the  nature of the planned procedure, indications, risks, alternatives and potential complications including but not limited to bleeding, infection, perforation, damage to internal organs and possible oversedation/side effects from anesthesia. The patient agrees and gives consent to proceed.  Please refer to procedure notes for findings, recommendations and patient disposition/instructions.     Raylene Miyamoto MD, MPH Gastroenterology 07/28/2019  9:58 AM

## 2019-07-28 NOTE — Op Note (Signed)
[facility] Gastroenterology Patient Name: Nichole Sanchez Procedure Date: 07/28/2019 9:46 AM MRN: [id number] Account #: [id number] Date of Birth: 12/15/1940 Admit Type: Outpatient Age: 79 Room: [facility] ENDO ROOM 3 Gender: Female Note Status: Finalized Procedure:             Upper GI endoscopy Indications:           Dyspepsia Providers:             Andrey Farmer MD, MD Medicines:             Monitored Anesthesia Care Complications:         No immediate complications. Estimated blood loss:                         Minimal. Procedure:             Pre-Anesthesia Assessment:                        - Prior to the procedure, a History and Physical was                         performed, and patient medications and allergies were                         reviewed. The patient is competent. The risks and                         benefits of the procedure and the sedation options and                         risks were discussed with the patient. All questions                         were answered and informed consent was obtained.                         Patient identification and proposed procedure were                         verified by the physician, the nurse, the anesthetist                         and the technician in the endoscopy suite. Mental                         Status Examination: alert and oriented. Airway                         Examination: normal oropharyngeal airway and neck                         mobility. Respiratory Examination: clear to                         auscultation. CV Examination: normal. Prophylactic                         Antibiotics: The patient does not require prophylactic  antibiotics. Prior Anticoagulants: The patient has                         taken no previous anticoagulant or antiplatelet                         agents. ASA Grade Assessment: II - A patient with mild                         systemic disease.  After reviewing the risks and                         benefits, the patient was deemed in satisfactory                         condition to undergo the procedure. The anesthesia                         plan was to use monitored anesthesia care (MAC).                         Immediately prior to administration of medications,                         the patient was re-assessed for adequacy to receive                         sedatives. The heart rate, respiratory rate, oxygen                         saturations, blood pressure, adequacy of pulmonary                         ventilation, and response to care were monitored                         throughout the procedure. The physical status of the                         patient was re-assessed after the procedure.                        After obtaining informed consent, the endoscope was                         passed under direct vision. Throughout the procedure,                         the patient's blood pressure, pulse, and oxygen                         saturations were monitored continuously. The Endoscope                         was introduced through the mouth, and advanced to the                         second part of duodenum. The upper GI endoscopy was  accomplished without difficulty. The patient tolerated                         the procedure well. Findings:      The examined esophagus was normal.      The entire examined stomach was normal. Biopsies were taken with a cold       forceps for Helicobacter pylori testing. Estimated blood loss was       minimal.      The examined duodenum was normal. Impression:            - Normal esophagus.                        - Normal stomach. Biopsied.                        - Normal examined duodenum. Recommendation:        - Discharge patient to home.                        - Resume previous diet.                        - Continue present medications.                         - Await pathology results.                        - Return to referring physician as previously                         scheduled. Procedure Code(s):     --- Professional ---                        [phone], Esophagogastroduodenoscopy, flexible,                         transoral; with biopsy, single or multiple Diagnosis Code(s):     --- Professional ---                        R10.13, Epigastric pain CPT copyright 2019 American Medical Association. All rights reserved. The codes documented in this report are preliminary and upon coder review may  be revised to meet current compliance requirements. Andrey Farmer, MD Andrey Farmer MD, MD 07/28/2019 10:17:10 AM Number of Addenda: 0 Note Initiated On: 07/28/2019 9:46 AM Estimated Blood Loss:  Estimated blood loss was minimal.      [facility]

## 2019-07-28 NOTE — Interval H&P Note (Signed)
History and Physical Interval Note:  07/28/2019 10:00 AM  Nichole Sanchez  has presented today for surgery, with the diagnosis of EPIG PAIN.  The various methods of treatment have been discussed with the patient and family. After consideration of risks, benefits and other options for treatment, the patient has consented to  Procedure(s): ESOPHAGOGASTRODUODENOSCOPY (EGD) WITH PROPOFOL (N/A) as a surgical intervention.  The patient's history has been reviewed, patient examined, no change in status, stable for surgery.  I have reviewed the patient's chart and labs.  Questions were answered to the patient's satisfaction.     Lesly Rubenstein  Ok to proceed with EGD.

## 2019-07-28 NOTE — Anesthesia Preprocedure Evaluation (Signed)
Anesthesia Evaluation  Patient identified by MRN, date of birth, ID band Patient awake    Reviewed: Allergy & Precautions, NPO status , Patient's Chart, lab work & pertinent test results  History of Anesthesia Complications Negative for: history of anesthetic complications  Airway Mallampati: II       Dental   Pulmonary asthma , neg sleep apnea, neg COPD, Not current smoker, former smoker,           Cardiovascular hypertension, Pt. on medications + CAD and + Cardiac Stents  (-) Past MI and (-) CHF (-) dysrhythmias (-) Valvular Problems/Murmurs     Neuro/Psych neg Seizures    GI/Hepatic Neg liver ROS, GERD  Medicated and Controlled,  Endo/Other  neg diabetesHypothyroidism   Renal/GU negative Renal ROS     Musculoskeletal   Abdominal   Peds  Hematology   Anesthesia Other Findings   Reproductive/Obstetrics                             Anesthesia Physical Anesthesia Plan  ASA: III  Anesthesia Plan: General   Post-op Pain Management:    Induction: Intravenous  PONV Risk Score and Plan: 3 and Propofol infusion, TIVA and Treatment may vary due to age or medical condition  Airway Management Planned: Nasal Cannula  Additional Equipment:   Intra-op Plan:   Post-operative Plan:   Informed Consent: I have reviewed the patients History and Physical, chart, labs and discussed the procedure including the risks, benefits and alternatives for the proposed anesthesia with the patient or authorized representative who has indicated his/her understanding and acceptance.       Plan Discussed with:   Anesthesia Plan Comments:         Anesthesia Quick Evaluation

## 2019-07-29 ENCOUNTER — Encounter: Payer: Self-pay | Admitting: Gastroenterology

## 2019-07-29 LAB — SURGICAL PATHOLOGY

## 2020-01-12 ENCOUNTER — Ambulatory Visit: Payer: Medicare Other

## 2020-06-10 ENCOUNTER — Ambulatory Visit: Payer: Medicare Other | Attending: Internal Medicine

## 2020-06-10 ENCOUNTER — Other Ambulatory Visit: Payer: Self-pay

## 2020-06-10 DIAGNOSIS — Z23 Encounter for immunization: Secondary | ICD-10-CM

## 2020-06-10 MED ORDER — PFIZER-BIONT COVID-19 VAC-TRIS 30 MCG/0.3ML IM SUSP
INTRAMUSCULAR | 0 refills | Status: DC
  Filled 2020-06-10: qty 0.3, 1d supply, fill #0

## 2020-06-10 NOTE — Progress Notes (Signed)
   Covid-19 Vaccination Clinic  Name:  HARRYETTE SHUART    MRN: [id number] DOB: 05/07/1940  06/10/2020  Ms. Khamis was observed post Covid-19 immunization for 15 minutes without incident. She was provided with Vaccine Information Sheet and instruction to access the V-Safe system.   Ms. Fischel was instructed to call 911 with any severe reactions post vaccine: Difficulty breathing  Swelling of face and throat  A fast heartbeat  A bad rash all over body  Dizziness and weakness   Immunizations Administered     Name Date Dose VIS Date Route   PFIZER Comrnaty(Gray TOP) Covid-19 Vaccine 06/10/2020  2:34 PM 0.3 mL 12/10/2019 Intramuscular   Manufacturer: [place]   Lot: [id number]   NDC: [place], PharmD, MBA Clinical Acute Care Pharmacist

## 2020-06-14 ENCOUNTER — Other Ambulatory Visit (HOSPITAL_COMMUNITY): Payer: Self-pay

## 2020-07-19 ENCOUNTER — Other Ambulatory Visit: Payer: Self-pay | Admitting: Podiatry

## 2020-07-25 ENCOUNTER — Other Ambulatory Visit: Payer: Self-pay | Admitting: Internal Medicine

## 2020-07-25 ENCOUNTER — Other Ambulatory Visit (HOSPITAL_COMMUNITY): Payer: Self-pay | Admitting: Internal Medicine

## 2020-07-25 DIAGNOSIS — J4520 Mild intermittent asthma, uncomplicated: Secondary | ICD-10-CM

## 2020-07-25 DIAGNOSIS — M21622 Bunionette of left foot: Secondary | ICD-10-CM

## 2020-07-25 DIAGNOSIS — E782 Mixed hyperlipidemia: Secondary | ICD-10-CM

## 2020-07-25 DIAGNOSIS — Z8616 Personal history of COVID-19: Secondary | ICD-10-CM

## 2020-07-25 DIAGNOSIS — M5416 Radiculopathy, lumbar region: Secondary | ICD-10-CM

## 2020-07-25 DIAGNOSIS — I10 Essential (primary) hypertension: Secondary | ICD-10-CM

## 2020-07-25 DIAGNOSIS — I25119 Atherosclerotic heart disease of native coronary artery with unspecified angina pectoris: Secondary | ICD-10-CM

## 2020-08-03 ENCOUNTER — Other Ambulatory Visit: Payer: Self-pay

## 2020-08-03 ENCOUNTER — Ambulatory Visit
Admission: RE | Admit: 2020-08-03 | Discharge: 2020-08-03 | Disposition: A | Discharge status: Home / Self Care | Payer: Medicare Other | Source: Ambulatory Visit | Attending: Internal Medicine | Admitting: Internal Medicine

## 2020-08-03 DIAGNOSIS — I25119 Atherosclerotic heart disease of native coronary artery with unspecified angina pectoris: Secondary | ICD-10-CM | POA: Insufficient documentation

## 2020-08-03 DIAGNOSIS — I10 Essential (primary) hypertension: Secondary | ICD-10-CM | POA: Insufficient documentation

## 2020-08-03 DIAGNOSIS — M21622 Bunionette of left foot: Secondary | ICD-10-CM | POA: Insufficient documentation

## 2020-08-03 DIAGNOSIS — Z8616 Personal history of COVID-19: Secondary | ICD-10-CM | POA: Diagnosis present

## 2020-08-03 DIAGNOSIS — J4520 Mild intermittent asthma, uncomplicated: Secondary | ICD-10-CM | POA: Diagnosis present

## 2020-08-03 DIAGNOSIS — E782 Mixed hyperlipidemia: Secondary | ICD-10-CM | POA: Diagnosis present

## 2020-08-03 DIAGNOSIS — M5416 Radiculopathy, lumbar region: Secondary | ICD-10-CM | POA: Insufficient documentation

## 2020-08-03 MED ORDER — GADOBUTROL 1 MMOL/ML IV SOLN
7.5000 mL | Freq: Once | INTRAVENOUS | Status: AC | PRN
  Administered 2020-08-03: 7.5 mL via INTRAVENOUS

## 2020-08-05 ENCOUNTER — Inpatient Hospital Stay
Admission: RE | Admit: 2020-08-05 | Discharge: 2020-08-05 | Disposition: A | Discharge status: Home / Self Care | Payer: Medicare Other | Source: Ambulatory Visit

## 2020-08-05 NOTE — Pre-Procedure Instructions (Signed)
cALLED PATIENT TO DO HER PHONE INTERVIEW AND SHE STATED THAT SHE WAS SCHEDULED TO SEE [place] ABOUT HER BACK FOLLOWING AN MRI AND HER PCP FELT HER BACK PROBLEMS ARE AT THIS TIME MORE URGENT THAN HER FOOT AND ARE TO CONTACT DR Alvera Singh OFFICE TO NOTIFY THEM OF THIS. CALLED DR FOWLER'S OFFICE TO NOTIFY THEM OF WHAT PATIENT HAD SAID. CAN RESCHEDULE FOR LATER DATE.

## 2020-08-11 ENCOUNTER — Other Ambulatory Visit: Payer: Self-pay | Admitting: Neurosurgery

## 2020-08-11 DIAGNOSIS — G959 Disease of spinal cord, unspecified: Secondary | ICD-10-CM

## 2020-08-12 ENCOUNTER — Encounter: Admission: RE | Payer: Self-pay | Source: Home / Self Care

## 2020-08-12 ENCOUNTER — Ambulatory Visit: Admission: RE | Admit: 2020-08-12 | Payer: Medicare Other | Source: Home / Self Care | Admitting: Podiatry

## 2020-08-12 SURGERY — OSTEOTOMY, WEIL
Anesthesia: Choice | Laterality: Left

## 2020-08-22 ENCOUNTER — Other Ambulatory Visit: Payer: Self-pay

## 2020-08-22 ENCOUNTER — Ambulatory Visit
Admission: RE | Admit: 2020-08-22 | Discharge: 2020-08-22 | Disposition: A | Discharge status: Home / Self Care | Payer: Medicare Other | Source: Ambulatory Visit | Attending: Neurosurgery | Admitting: Neurosurgery

## 2020-08-22 DIAGNOSIS — G959 Disease of spinal cord, unspecified: Secondary | ICD-10-CM | POA: Diagnosis not present

## 2020-09-08 ENCOUNTER — Other Ambulatory Visit: Payer: Self-pay | Admitting: Neurosurgery

## 2020-09-19 ENCOUNTER — Other Ambulatory Visit: Payer: Self-pay

## 2020-09-19 ENCOUNTER — Other Ambulatory Visit
Admission: RE | Admit: 2020-09-19 | Discharge: 2020-09-19 | Disposition: A | Discharge status: Home / Self Care | Payer: Medicare Other | Source: Ambulatory Visit | Attending: Neurosurgery | Admitting: Neurosurgery

## 2020-09-19 DIAGNOSIS — Z01818 Encounter for other preprocedural examination: Secondary | ICD-10-CM | POA: Insufficient documentation

## 2020-09-19 HISTORY — DX: Migraine, unspecified, not intractable, without status migrainosus: G43.909

## 2020-09-19 HISTORY — DX: Atherosclerotic heart disease of native coronary artery without angina pectoris: I25.10

## 2020-09-19 HISTORY — DX: Spinal stenosis, lumbar region with neurogenic claudication: M48.062

## 2020-09-19 HISTORY — DX: Dyspnea, unspecified: R06.00

## 2020-09-19 LAB — BASIC METABOLIC PANEL
Anion gap: 9 (ref 5–15)
BUN: 18 mg/dL (ref 8–23)
CO2: 22 mmol/L (ref 22–32)
Calcium: 9.4 mg/dL (ref 8.9–10.3)
Chloride: 107 mmol/L (ref 98–111)
Creatinine, Ser: 0.76 mg/dL (ref 0.44–1.00)
GFR, Estimated: >60 mL/min (ref >60)
Glucose, Bld: 105 mg/dL — ABNORMAL HIGH (ref 70–99)
Potassium: 4.2 mmol/L (ref 3.5–5.1)
Sodium: 138 mmol/L (ref 135–145)

## 2020-09-19 LAB — SURGICAL PCR SCREEN
MRSA, PCR: NEGATIVE
Staphylococcus aureus: NEGATIVE

## 2020-09-19 LAB — CBC
HCT: 39.3 % (ref 36.0–46.0)
Hemoglobin: 13.9 g/dL (ref 12.0–15.0)
MCH: 31.2 pg (ref 26.0–34.0)
MCHC: 35.4 g/dL (ref 30.0–36.0)
MCV: 88.1 fL (ref 80.0–100.0)
Platelets: 213 10*3/uL (ref 150–400)
RBC: 4.46 MIL/uL (ref 3.87–5.11)
RDW: 12.4 % (ref 11.5–15.5)
WBC: 5.7 10*3/uL (ref 4.0–10.5)
nRBC: 0 % (ref 0.0–0.2)

## 2020-09-19 LAB — TYPE AND SCREEN
ABO/RH(D): O POS
Antibody Screen: NEGATIVE

## 2020-09-19 LAB — PROTIME-INR
INR: 1 (ref 0.8–1.2)
Prothrombin Time: 13.6 seconds (ref 11.4–15.2)

## 2020-09-19 LAB — APTT: aPTT: 38 seconds — ABNORMAL HIGH (ref 24–36)

## 2020-09-19 NOTE — Patient Instructions (Addendum)
Your procedure is scheduled on: Wednesday September 28, 2020. Report to Day Surgery inside [facility] 2nd floor stop by admissions desk before getting on elevator. To find out your arrival time please call [phone] between 1PM - 3PM on Tuesday September 27, 2020.  Remember: Instructions that are not followed completely may result in serious medical risk,  up to and including death, or upon the discretion of your surgeon and anesthesiologist your  surgery may need to be rescheduled.     _X__ 1. Do not eat food after midnight the night before your procedure.                 No chewing gum or hard candies. You may drink clear liquids up to 2 hours                 before you are scheduled to arrive for your surgery- DO not drink clear                 liquids within 2 hours of the start of your surgery.                 Clear Liquids include:  water, apple juice without pulp, clear Gatorade, G2 or                  Gatorade Zero (avoid Red/Purple/Blue), Black Coffee or Tea (Do not add                 anything to coffee or tea).  __X__2.  On the morning of surgery brush your teeth with toothpaste and water, you                may rinse your mouth with mouthwash if you wish.  Do not swallow any toothpaste of mouthwash.     _X__ 3.  No Alcohol for 24 hours before or after surgery.   _X__ 4.  Do Not Smoke or use e-cigarettes For 24 Hours Prior to Your Surgery.                 Do not use any chewable tobacco products for at least 6 hours prior to                 Surgery.  _X__  5.  Do not use any recreational drugs (marijuana, cocaine, heroin, ecstasy, MDMA or other)                For at least one week prior to your surgery.  Combination of these drugs with anesthesia                May have life threatening results.  __X__ 6.  Notify your doctor if there is any change in your medical condition      (cold, fever, infections).     Do not wear jewelry, make-up,  hairpins, clips or nail polish. Do not wear lotions, powders, or perfumes. You may wear deodorant. Do not shave 48 hours prior to surgery. Men may shave face and neck. Do not bring valuables to the hospital.    [facility] is not responsible for any belongings or valuables.  Contacts, dentures or bridgework may not be worn into surgery. Leave your suitcase in the car. After surgery it may be brought to your room. For patients admitted to the hospital, discharge time is determined by your treatment team.   Patients discharged the day of surgery will not be allowed to drive home.  Make arrangements for someone to be with you for the first 24 hours of your Same Day Discharge.   __X__ Take these medicines the morning of surgery with A SIP OF WATER:    1. amLODipine (NORVASC) 2.5 MG   2. levothyroxine (SYNTHROID) 88 MCG  3. meclizine (ANTIVERT) 25 MG  4. pantoprazole (PROTONIX) 20 MG (take 1 dose the night before the surgery as well)  5. methocarbamol (ROBAXIN) 500 MG   6. loratadine (CLARITIN) 10 MG  7. montelukast (SINGULAIR) 10 MG   ____ Fleet Enema (as directed)   __X__ Use CHG Soap (or wipes) as directed  ____ Use Benzoyl Peroxide Gel as instructed  ____ Use inhalers on the day of surgery  ____ Stop metformin 2 days prior to surgery    ____ Take 1/2 of usual insulin dose the night before surgery. No insulin the morning          of surgery.   __X__ Stop aspirin 81 7 days before your surgery.    __X__ One Week prior to surgery- Stop Anti-inflammatories such as Ibuprofen, Aleve, Advil, Motrin, meloxicam (MOBIC), diclofenac, etodolac, ketorolac, Toradol, Daypro, piroxicam, Goody's or BC powders. OK TO USE TYLENOL IF NEEDED   __X__ One week prior to surgery stop supplements and vitamins until after surgery.    ____ Bring C-Pap to the hospital.    If you have any questions regarding your pre-procedure instructions,  Please call Pre-admit Testing at [phone]

## 2020-09-20 ENCOUNTER — Encounter: Payer: Self-pay | Admitting: Neurosurgery

## 2020-09-20 NOTE — Progress Notes (Signed)
Perioperative Services  Pre-Admission/Anesthesia Testing Clinical Review  Date: 09/20/20  Patient Demographics:  Name: Nichole Sanchez DOB:   [birth year] MRN:   [id number]  Planned Surgical Procedure(s):    Case: [id number] Date/Time: 09/28/20 0927   Procedure: L4-5 DECOMPRESSION   Anesthesia type: General   Pre-op diagnosis: Neurogenic claudication due to lumbar spinal stenosis M48.062   Location: [facility] OR ROOM 03 / [facility]   Surgeons: Meade Maw, MD     NOTE: Available PAT nursing documentation and vital signs have been reviewed. Clinical nursing staff has updated patient's PMH/PSHx, current medication list, and drug allergies/intolerances to ensure comprehensive history available to assist in medical decision making as it pertains to the aforementioned surgical procedure and anticipated anesthetic course. Extensive review of available clinical information performed. [place] PMH and PSHx updated with any diagnoses/procedures that  may have been inadvertently omitted during her intake with the pre-admission testing department's nursing staff.  Clinical Discussion:  Nichole Sanchez is a 80 y.o. female who is submitted for pre-surgical anesthesia review and clearance prior to her undergoing the above procedure. Patient is a Former Smoker (quit 1976). Pertinent PMH includes: CAD, valvular regurgitation, HTN, HLD, hypothyroidism, SOB, GERD (on daily PPI), OA, lumbar stenosis with neurogenic claudication.  Patient is followed by cardiology Nichole Glassing, MD). She was last seen in the cardiology clinic on 01/27/2020; notes reviewed.  At the time of her clinic visit, patient reported to be doing "fairly well" from a cardiovascular perspective.  She complained of cough and shortness of breath.  Patient denied chest pain, PND, orthopnea, palpitations, significant peripheral edema, vertiginous symptoms, or presyncope/syncope.  PMH significant for cardiovascular  diagnoses.  Myocardial perfusion imaging study performed on 02/22/2017 revealed an LVEF of 67%.  There were no regional wall motion abnormalities.  Left ventricular cavity size normal.  There was no evidence of significant stress-induced reversible ischemia or arrhythmia.  Study attempted be normal and low risk.  TTE performed on 02/23/2017 revealed an LVEF of >55%.  There was globally normal systolic function with mild mitral and tricuspid valve regurgitation.  There was no evidence of valvular stenosis.  Patient underwent diagnostic left heart catheterization on 03/19/2017 that revealed a normal left ventricular systolic function and LVEDP.  There were 99% stenoses within the mid RCA and distal RCA.  PCI was performed and a 2.50 x 18 mm Xience Sierra DES x 1 was placed to the mid RCA.  Decision was made to treat distal lesion medically.  Most recent noninvasive cardiovascular testing included a TTE (06/19/2019) and a myocardial perfusion imaging study (06/23/2019) that remain grossly unchanged from previous studies.  Blood pressure well controlled at 120/74 on currently prescribed CCB and ARB therapies.  Patient is on a statin and omega-3 fatty acid therapies for her HLD diagnosis.  Patient is not diabetic. Functional capacity, as defined by DASI, is documented as being >/= 4 METS.  No significant changes were made to her medication regimen.  Patient to follow-up with outpatient cardiology and 6 months or sooner if needed.  Nichole Sanchez is scheduled for an L4-5 DECOMPRESSION on 09/28/2020 with Dr. Meade Maw, MD.  Given patient's past medical history significant for cardiovascular diagnoses, presurgical cardiac clearance was sought by the PAT team. Per cardiology, "this patient is optimized for surgery and may proceed with the planned procedural course with a LOW risk of significant perioperative cardiovascular complications".  This patient is on daily antiplatelet therapy. She has been  instructed on recommendations for  holding her daily low-dose ASA for 5 days prior to her procedure with plans to restart as soon as postoperative bleeding risk felt to be minimized by her attending surgeon. The patient has been instructed that her last dose of her anticoagulant will be on 09/22/2020.  Patient denies previous perioperative complications with anesthesia in the past. In review of the available records, it is noted that patient underwent a general anesthetic course here (ASA III) in 07/2019 without documented complications.   Vitals with BMI 09/19/2020 07/28/2019 07/28/2019  Height 5\' 2"  - 5\' 2"   Weight 179 lbs 6 oz - 191 lbs  BMI 91.7 - 91.50  Systolic 569 794 801  Diastolic 70 53 86  Pulse 79 - 86    Providers/Specialists:   NOTE: Primary physician provider listed below. Patient may have been seen by APP or partner within same practice.   PROVIDER ROLE / SPECIALTY LAST Dola Factor, MD Neurosurgery 09/08/2020  Tracie Harrier, MD Primary Care Provider 07/25/2020  Bartholome Bill, MD Cardiology 01/27/2020   Allergies:  Propylene glycol, Tricor [fenofibrate], Tussin cough [dextromethorphan hbr], Advair diskus [fluticasone-salmeterol], Albuterol sulfate, Benzalkonium chloride, Cyclobenzaprine, Hctz [hydrochlorothiazide], Neosporin [neomycin-bacitracin zn-polymyx], Nickel, Prednisone, Proair hfa [albuterol], Tylenol [acetaminophen], Codeine, Esomeprazole, Etodolac, Latex, Naproxen, Nystatin, Oxycodone-acetaminophen, Polysporin [bacitracin-polymyxin b], Tape, and Tussionex pennkinetic er [hydrocod polst-cpm polst er]  Current Home Medications:   No current facility-administered medications for this encounter.    acetaminophen (TYLENOL) 500 MG tablet   amLODipine (NORVASC) 2.5 MG tablet   aspirin EC 81 MG tablet   B Complex-C (SUPER B COMPLEX PO)   candesartan (ATACAND) 32 MG tablet   Cholecalciferol (VITAMIN D) 2000 units CAPS   levothyroxine (SYNTHROID) 88 MCG  tablet   loratadine (CLARITIN) 10 MG tablet   meclizine (ANTIVERT) 25 MG tablet   meloxicam (MOBIC) 15 MG tablet   methocarbamol (ROBAXIN) 500 MG tablet   montelukast (SINGULAIR) 10 MG tablet   Omega-3 Fatty Acids (FISH OIL) 1000 MG CAPS   pantoprazole (PROTONIX) 20 MG tablet   rosuvastatin (CRESTOR) 5 MG tablet   vitamin E 180 MG (400 UNITS) capsule   COVID-19 mRNA Vac-TriS, Pfizer, (PFIZER-BIONT COVID-19 VAC-TRIS) SUSP injection   traMADol (ULTRAM) 50 MG tablet   History:   Past Medical History:  Diagnosis Date   Arthritis    Asthma    well controlled-no inhalers   Bronchitis    Carpal tunnel syndrome    Coronary artery disease    a.) LHC 03/19/2017 --> 99% mRCA, 99% dRCA; 2.50 x 18 mm Xience Sierra DES x 1 to [facility]; distal lesion treated medically.   Diverticulosis    Dyspnea    GERD (gastroesophageal reflux disease)    Hemorrhoids    a.) s/p banding in 2011   History of colon polyps    Hyperlipidemia    Hypertension    Hypothyroidism    Lumbar stenosis with neurogenic claudication    Migraines    in the 1980s   Pneumonia    Valvular regurgitation    a.)  TTE 06/19/2019 --> LVEF >55%; mild MR; trivial AR, TR, PR.   Past Surgical History:  Procedure Laterality Date   APPENDECTOMY N/A    CARPAL TUNNEL RELEASE Right 06/25/2016   Procedure: CARPAL TUNNEL RELEASE;  Surgeon: Earnestine Leys, MD;  Location: [facility] ORS;  Service: Orthopedics;  Laterality: Right;   CATARACT EXTRACTION W/PHACO Right 08/02/2014   Procedure: CATARACT EXTRACTION PHACO AND INTRAOCULAR LENS PLACEMENT (IOC);  Surgeon: Estill Cotta, MD;  Location: [facility] ORS;  Service: Ophthalmology;  Laterality: Right;  [place]:  01:30.3 AP%: 16.4 CDE: 28.22  Fluid Lot# [id number]   CATARACT EXTRACTION W/PHACO Left 08/23/2014   Procedure: CATARACT EXTRACTION PHACO AND INTRAOCULAR LENS PLACEMENT (IOC);  Surgeon: Estill Cotta, MD;  Location: [facility] ORS;  Service: Ophthalmology;  Laterality: Left;  [place] 01:11.4 AP%  23.0 CDE 28.17 FLUID LOT# [id number]   CHOLECYSTECTOMY N/A 2011   COLONOSCOPY N/A 02/23/2009   COLONOSCOPY WITH PROPOFOL N/A 09/19/2016   Procedure: COLONOSCOPY WITH PROPOFOL;  Surgeon: Manya Silvas, MD;  Location: [facility] ENDOSCOPY;  Service: Endoscopy;  Laterality: N/A;   CORONARY STENT INTERVENTION N/A 03/12/2017   Procedure: CORONARY STENT INTERVENTION;  Surgeon: Isaias Cowman, MD;  Location: [facility] CV LAB;  Service: Cardiovascular;  Laterality: N/A;   DILATION AND CURETTAGE OF UTERUS     ESOPHAGOGASTRODUODENOSCOPY N/A 02/23/2009   ESOPHAGOGASTRODUODENOSCOPY N/A 03/31/2012   ESOPHAGOGASTRODUODENOSCOPY N/A 11/24/1999   ESOPHAGOGASTRODUODENOSCOPY (EGD) WITH PROPOFOL N/A 07/28/2019   Procedure: ESOPHAGOGASTRODUODENOSCOPY (EGD) WITH PROPOFOL;  Surgeon: Lesly Rubenstein, MD;  Location: [facility] ENDOSCOPY;  Service: Endoscopy;  Laterality: N/A;   EYE SURGERY Bilateral 06/2016   LASER SURGERY "FOR CLOUDY EYES"   HEMORRHOID BANDING N/A 2011   KNEE ARTHROSCOPY Left 2014   LEFT HEART CATH AND CORONARY ANGIOGRAPHY Left 03/12/2017   Procedure: LEFT HEART CATH AND CORONARY ANGIOGRAPHY;  Surgeon: Teodoro Spray, MD;  Location: [facility] CV LAB;  Service: Cardiovascular;  Laterality: Left;   TONSILLECTOMY N/A 1963   TOTAL KNEE ARTHROPLASTY Right    TRIGGER FINGER RELEASE Left    VAGINAL HYSTERECTOMY N/A 1986   WRIST JOINT AND TENDON TRANSPLANT     late 1990s   No family history on file. Social History   Tobacco Use   Smoking status: Former    Types: Cigarettes    Quit date: 1976    Years since quitting: 46.7   Smokeless tobacco: Never  Vaping Use   Vaping Use: Never used  Substance Use Topics   Alcohol use: No   Drug use: No    Pertinent Clinical Results:  LABS: Labs reviewed: Acceptable for surgery.  Hospital Outpatient Visit on 09/19/2020  Component Date Value Ref Range Status   aPTT 09/19/2020 38 (A) 24 - 36 seconds Final   Comment:        IF BASELINE aPTT  IS ELEVATED, SUGGEST PATIENT RISK ASSESSMENT BE USED TO DETERMINE APPROPRIATE ANTICOAGULANT THERAPY. Performed at [facility], [place],  [address]    Sodium 09/19/2020 138  135 - 145 mmol/L Final   Potassium 09/19/2020 4.2  3.5 - 5.1 mmol/L Final   Chloride 09/19/2020 107  98 - 111 mmol/L Final   CO2 09/19/2020 22  22 - 32 mmol/L Final   Glucose, Bld 09/19/2020 105 (A) 70 - 99 mg/dL Final   Glucose reference range applies only to samples taken after fasting for at least 8 hours.   BUN 09/19/2020 18  8 - 23 mg/dL Final   Creatinine, Ser 09/19/2020 0.76  0.44 - 1.00 mg/dL Final   Calcium 09/19/2020 9.4  8.9 - 10.3 mg/dL Final   GFR, Estimated 09/19/2020 >60  >60 mL/min Final   Comment: (NOTE) Calculated using the CKD-EPI Creatinine Equation (2021)    Anion gap 09/19/2020 9  5 - 15 Final   Performed at [facility], [place], [place] [address]   WBC 09/19/2020 5.7  4.0 - 10.5 K/uL Final   RBC 09/19/2020 4.46  3.87 - 5.11  MIL/uL Final   Hemoglobin 09/19/2020 13.9  12.0 - 15.0 g/dL Final   HCT 09/19/2020 39.3  36.0 - 46.0 % Final   MCV 09/19/2020 88.1  80.0 - 100.0 fL Final   MCH 09/19/2020 31.2  26.0 - 34.0 pg Final   MCHC 09/19/2020 35.4  30.0 - 36.0 g/dL Final   RDW 09/19/2020 12.4  11.5 - 15.5 % Final   Platelets 09/19/2020 213  150 - 400 K/uL Final   nRBC 09/19/2020 0.0  0.0 - 0.2 % Final   Performed at [facility], [address] [place], [place] [address]   Prothrombin Time 09/19/2020 13.6  11.4 - 15.2 seconds Final   INR 09/19/2020 1.0  0.8 - 1.2 Final   Comment: (NOTE) INR goal varies based on device and disease states. Performed at [facility], [address] [place], [place] [address]    MRSA, PCR 09/19/2020 NEGATIVE  NEGATIVE Final   Staphylococcus aureus 09/19/2020 NEGATIVE  NEGATIVE Final   Comment: (NOTE) The Xpert SA Assay (FDA approved for NASAL specimens in patients 48 years of  age and older), is one component of a comprehensive surveillance program. It is not intended to diagnose infection nor to guide or monitor treatment. Performed at [facility], [address] [place], [place] [address]    ABO/RH(D) 09/19/2020 O POS   Final   Antibody Screen 09/19/2020 NEG   Final   Sample Expiration 09/19/2020 10/03/2020,2359   Final   Extend sample reason 09/19/2020    Final                   Value:NO TRANSFUSIONS OR PREGNANCY IN THE PAST 3 MONTHS Performed at [facility], [address] [place], [place] [address]     ECG: Date: 09/19/2020 Time ECG obtained: 0827 AM Rate: 73 bpm Rhythm: normal sinus Axis (leads I and aVF): Normal Intervals: PR 172 ms. QRS 80 ms. QTc 434 ms. ST segment and T wave changes: No evidence of acute ST segment elevation or depression Comparison: Similar to previous tracing obtained on 05/13/2019   IMAGING / PROCEDURES: LEXISCAN performed on 06/23/2019 LVEF 68% Normal myocardial thickening and wall motion No artifacts noted Left ventricular cavity size normal No evidence of stress-induced myocardial ischemia or arrhythmia Normal low risk study  TRANSTHORACIC ECHOCARDIOGRAM performed on 06/19/2019 LVEF >55% Normal left ventricular systolic function Normal right ventricular systolic function Trivial AR, TR, and PR Mild MR No evidence of valvular stenosis No pericardial effusion  LEFT HEART CATHETERIZATION AND CORONARY ANGIOGRAPHY performed on 03/12/2017 Left-ventricular systolic function normal LVEDP normal No mitral valve stenosis or prolapse evident CAD  99% stenosis mid RCA 99% stenosis RPAV Successful PCI 2.50 x 18 mm Xience Sierra DES x 1 to the mid RCA Distal lesion treated medically  Impression and Plan:  Nichole Sanchez has been referred for pre-anesthesia review and clearance prior to her undergoing the planned anesthetic and procedural courses. Available labs, pertinent testing, and  imaging results were personally reviewed by me. This patient has been appropriately cleared by cardiology with an overall LOW risk of significant perioperative cardiovascular complications.  Based on clinical review performed today (09/20/20), barring any significant acute changes in the patient's overall condition, it is anticipated that she will be able to proceed with the planned surgical intervention. Any acute changes in clinical condition may necessitate her procedure being postponed and/or cancelled. Patient will meet with anesthesia team (MD and/or CRNA) on the day of her procedure for  preoperative evaluation/assessment. Questions regarding anesthetic course will be fielded at that time.   Pre-surgical instructions were reviewed with the patient during her PAT appointment and questions were fielded by PAT clinical staff. Patient was advised that if any questions or concerns arise prior to her procedure then she should return a call to PAT and/or her surgeon's office to discuss.  Honor Loh, MSN, APRN, FNP-C, CEN [facility]  Peri-operative Services Nurse Practitioner Phone: [phone] Fax: [phone] 09/20/20 10:18 AM  NOTE: This note has been prepared using Dragon dictation software. Despite my best ability to proofread, there is always the potential that unintentional transcriptional errors may still occur from this process.

## 2020-09-27 MED ORDER — CEFAZOLIN SODIUM-DEXTROSE 2-4 GM/100ML-% IV SOLN
2.0000 g | INTRAVENOUS | Status: AC
  Administered 2020-09-28: 2 g via INTRAVENOUS

## 2020-09-27 MED ORDER — LACTATED RINGERS IV SOLN: INTRAVENOUS | Status: DC

## 2020-09-27 NOTE — Progress Notes (Signed)
Pharmacy Antibiotic Note  Nichole Sanchez is a 80 y.o. female admitted on (Not on file) with surgical prophylaxis.  Pharmacy has been consulted for Cefazolin dosing.  TBW = 81.4 kg   Plan: Cefazolin 2 gm IV X 1 60 min pre-op ordered for 9/28 @ 0500.      No data recorded.  No results for input(s): WBC, CREATININE, LATICACIDVEN, VANCOTROUGH, VANCOPEAK, VANCORANDOM, GENTTROUGH, GENTPEAK, GENTRANDOM, TOBRATROUGH, TOBRAPEAK, TOBRARND, AMIKACINPEAK, AMIKACINTROU, AMIKACIN in the last 168 hours.  Estimated Creatinine Clearance: 57.3 mL/min (by C-G formula based on SCr of 0.76 mg/dL).    Allergies  Allergen Reactions   Propylene Glycol Rash and Other (See Comments)    blisters   Tricor [Fenofibrate] Shortness Of Breath    Increases blood pressure   Tussin Cough [Dextromethorphan Hbr] Rash and Other (See Comments)    Propylene glychol Increases blood pressure   Advair Diskus [Fluticasone-Salmeterol] Other (See Comments)    blisters   Albuterol Sulfate Swelling   Benzalkonium Chloride Other (See Comments)    Unknown   Cyclobenzaprine Other (See Comments)    Unknown   Hctz [Hydrochlorothiazide] Other (See Comments)    blisters   Neosporin [Neomycin-Bacitracin Zn-Polymyx] Other (See Comments)    blisters   Nickel Other (See Comments)    Itching, blister and raw rash   Prednisone Other (See Comments)    Can not take in large doses   Proair Hfa [Albuterol] Swelling   Tylenol [Acetaminophen] Swelling and Other (See Comments)    Cannot take with any other medications   Codeine Nausea And Vomiting and Rash   Esomeprazole Rash and Other (See Comments)    Writing on pill has propylene glychol   Etodolac Palpitations   Latex Rash   Naproxen Palpitations   Nystatin Rash   Oxycodone-Acetaminophen Rash   Polysporin [Bacitracin-Polymyxin B] Rash   Tape Rash   Tussionex Pennkinetic Er [Hydrocod Polst-Cpm Polst Er] Rash    Antimicrobials this admission:   >>    >>   Dose  adjustments this admission:   Microbiology results:  BCx:   UCx:    Sputum:    MRSA PCR:   Thank you for allowing pharmacy to be a part of this patient's care.  Dorman Calderwood D 09/27/2020 11:36 PM

## 2020-09-28 ENCOUNTER — Ambulatory Visit: Payer: Medicare Other | Admitting: Urgent Care

## 2020-09-28 ENCOUNTER — Ambulatory Visit: Payer: Medicare Other

## 2020-09-28 ENCOUNTER — Ambulatory Visit
Admission: RE | Admit: 2020-09-28 | Discharge: 2020-09-28 | Disposition: A | Discharge status: Home / Self Care | Payer: Medicare Other | Source: Ambulatory Visit | Attending: Neurosurgery | Admitting: Neurosurgery

## 2020-09-28 ENCOUNTER — Encounter: Payer: Self-pay | Admitting: Neurosurgery

## 2020-09-28 ENCOUNTER — Encounter
Admission: RE | Disposition: A | Discharge status: Home / Self Care | Payer: Self-pay | Source: Ambulatory Visit | Attending: Neurosurgery

## 2020-09-28 ENCOUNTER — Other Ambulatory Visit: Payer: Self-pay

## 2020-09-28 DIAGNOSIS — K219 Gastro-esophageal reflux disease without esophagitis: Secondary | ICD-10-CM | POA: Insufficient documentation

## 2020-09-28 DIAGNOSIS — Z7982 Long term (current) use of aspirin: Secondary | ICD-10-CM | POA: Diagnosis not present

## 2020-09-28 DIAGNOSIS — Z87891 Personal history of nicotine dependence: Secondary | ICD-10-CM | POA: Diagnosis not present

## 2020-09-28 DIAGNOSIS — Z885 Allergy status to narcotic agent status: Secondary | ICD-10-CM | POA: Insufficient documentation

## 2020-09-28 DIAGNOSIS — Z96651 Presence of right artificial knee joint: Secondary | ICD-10-CM | POA: Insufficient documentation

## 2020-09-28 DIAGNOSIS — Z79890 Hormone replacement therapy (postmenopausal): Secondary | ICD-10-CM | POA: Diagnosis not present

## 2020-09-28 DIAGNOSIS — Z888 Allergy status to other drugs, medicaments and biological substances status: Secondary | ICD-10-CM | POA: Diagnosis not present

## 2020-09-28 DIAGNOSIS — M5416 Radiculopathy, lumbar region: Secondary | ICD-10-CM | POA: Diagnosis not present

## 2020-09-28 DIAGNOSIS — M48062 Spinal stenosis, lumbar region with neurogenic claudication: Secondary | ICD-10-CM | POA: Insufficient documentation

## 2020-09-28 DIAGNOSIS — Z419 Encounter for procedure for purposes other than remedying health state, unspecified: Secondary | ICD-10-CM

## 2020-09-28 DIAGNOSIS — I10 Essential (primary) hypertension: Secondary | ICD-10-CM | POA: Diagnosis not present

## 2020-09-28 DIAGNOSIS — Z91040 Latex allergy status: Secondary | ICD-10-CM | POA: Insufficient documentation

## 2020-09-28 DIAGNOSIS — E785 Hyperlipidemia, unspecified: Secondary | ICD-10-CM | POA: Diagnosis not present

## 2020-09-28 DIAGNOSIS — Z79899 Other long term (current) drug therapy: Secondary | ICD-10-CM | POA: Insufficient documentation

## 2020-09-28 DIAGNOSIS — Z886 Allergy status to analgesic agent status: Secondary | ICD-10-CM | POA: Diagnosis not present

## 2020-09-28 DIAGNOSIS — I2510 Atherosclerotic heart disease of native coronary artery without angina pectoris: Secondary | ICD-10-CM | POA: Diagnosis not present

## 2020-09-28 DIAGNOSIS — I38 Endocarditis, valve unspecified: Secondary | ICD-10-CM | POA: Insufficient documentation

## 2020-09-28 DIAGNOSIS — Z881 Allergy status to other antibiotic agents status: Secondary | ICD-10-CM | POA: Insufficient documentation

## 2020-09-28 DIAGNOSIS — M1990 Unspecified osteoarthritis, unspecified site: Secondary | ICD-10-CM | POA: Insufficient documentation

## 2020-09-28 DIAGNOSIS — E039 Hypothyroidism, unspecified: Secondary | ICD-10-CM | POA: Diagnosis not present

## 2020-09-28 DIAGNOSIS — Z791 Long term (current) use of non-steroidal anti-inflammatories (NSAID): Secondary | ICD-10-CM | POA: Diagnosis not present

## 2020-09-28 HISTORY — PX: LUMBAR LAMINECTOMY/DECOMPRESSION MICRODISCECTOMY: SHX5026

## 2020-09-28 HISTORY — DX: Endocarditis, valve unspecified: I38

## 2020-09-28 LAB — ABO/RH: ABO/RH(D): O POS

## 2020-09-28 SURGERY — LUMBAR LAMINECTOMY/DECOMPRESSION MICRODISCECTOMY 1 LEVEL
Anesthesia: General

## 2020-09-28 MED ORDER — TRAMADOL HCL 50 MG PO TABS
ORAL_TABLET | ORAL | Status: AC
  Administered 2020-09-28: 50 mg via ORAL
  Filled 2020-09-28: qty 1

## 2020-09-28 MED ORDER — FENTANYL CITRATE (PF) 100 MCG/2ML IJ SOLN: 25.0000 ug | INTRAMUSCULAR | Status: DC | PRN

## 2020-09-28 MED ORDER — BUPIVACAINE HCL 0.5 % IJ SOLN
INTRAMUSCULAR | Status: DC | PRN
  Administered 2020-09-28: 20 mL

## 2020-09-28 MED ORDER — PROPOFOL 10 MG/ML IV BOLUS
INTRAVENOUS | Status: DC | PRN
Start: 2020-09-28 — End: 2020-09-28
  Administered 2020-09-28 (×2): 50 mg via INTRAVENOUS
  Administered 2020-09-28: 30 mg via INTRAVENOUS
  Administered 2020-09-28: 100 mg via INTRAVENOUS

## 2020-09-28 MED ORDER — PHENYLEPHRINE HCL (PRESSORS) 10 MG/ML IV SOLN
INTRAVENOUS | Status: AC
  Filled 2020-09-28: qty 1

## 2020-09-28 MED ORDER — FENTANYL CITRATE (PF) 100 MCG/2ML IJ SOLN
INTRAMUSCULAR | Status: DC | PRN
  Administered 2020-09-28 (×2): 50 ug via INTRAVENOUS

## 2020-09-28 MED ORDER — SODIUM CHLORIDE 0.9 % IV SOLN
INTRAVENOUS | Status: DC | PRN
  Administered 2020-09-28: 40 ug/min via INTRAVENOUS

## 2020-09-28 MED ORDER — BUPIVACAINE-EPINEPHRINE (PF) 0.5% -1:200000 IJ SOLN
INTRAMUSCULAR | Status: DC | PRN
  Administered 2020-09-28: 7 mL

## 2020-09-28 MED ORDER — DEXMEDETOMIDINE (PRECEDEX) IN NS 20 MCG/5ML (4 MCG/ML) IV SYRINGE
PREFILLED_SYRINGE | INTRAVENOUS | Status: DC | PRN
  Administered 2020-09-28: 8 ug via INTRAVENOUS
  Administered 2020-09-28 (×2): 4 ug via INTRAVENOUS

## 2020-09-28 MED ORDER — PROPOFOL 10 MG/ML IV BOLUS
INTRAVENOUS | Status: AC
  Filled 2020-09-28: qty 20

## 2020-09-28 MED ORDER — SODIUM CHLORIDE 0.9 % IV SOLN
INTRAVENOUS | Status: DC | PRN
  Administered 2020-09-28: 40 mL

## 2020-09-28 MED ORDER — ONDANSETRON HCL 4 MG/2ML IJ SOLN
INTRAMUSCULAR | Status: AC
  Filled 2020-09-28: qty 2

## 2020-09-28 MED ORDER — GLYCOPYRROLATE 0.2 MG/ML IJ SOLN
INTRAMUSCULAR | Status: DC | PRN
  Administered 2020-09-28: .2 mg via INTRAVENOUS

## 2020-09-28 MED ORDER — DEXAMETHASONE SODIUM PHOSPHATE 10 MG/ML IJ SOLN
INTRAMUSCULAR | Status: DC | PRN
  Administered 2020-09-28: 10 mg via INTRAVENOUS

## 2020-09-28 MED ORDER — TRAMADOL HCL 50 MG PO TABS: 50.0000 mg | ORAL_TABLET | Freq: Four times a day (QID) | ORAL | 0 refills | Status: AC | PRN

## 2020-09-28 MED ORDER — CEFAZOLIN SODIUM-DEXTROSE 2-4 GM/100ML-% IV SOLN
INTRAVENOUS | Status: AC
  Filled 2020-09-28: qty 100

## 2020-09-28 MED ORDER — ONDANSETRON HCL 4 MG/2ML IJ SOLN: 4.0000 mg | Freq: Once | INTRAMUSCULAR | Status: DC | PRN

## 2020-09-28 MED ORDER — DIPHENHYDRAMINE HCL 50 MG/ML IJ SOLN
INTRAMUSCULAR | Status: DC | PRN
  Administered 2020-09-28: 25 mg via INTRAVENOUS

## 2020-09-28 MED ORDER — ONDANSETRON HCL 4 MG/2ML IJ SOLN
INTRAMUSCULAR | Status: DC | PRN
  Administered 2020-09-28: 4 mg via INTRAVENOUS

## 2020-09-28 MED ORDER — PROPOFOL 1000 MG/100ML IV EMUL
INTRAVENOUS | Status: AC
  Filled 2020-09-28: qty 100

## 2020-09-28 MED ORDER — HEMOSTATIC AGENTS (NO CHARGE) OPTIME
TOPICAL | Status: DC | PRN
  Administered 2020-09-28: 1 via TOPICAL

## 2020-09-28 MED ORDER — ACETAMINOPHEN 10 MG/ML IV SOLN: 1000.0000 mg | Freq: Once | INTRAVENOUS | Status: DC | PRN

## 2020-09-28 MED ORDER — ROCURONIUM BROMIDE 10 MG/ML (PF) SYRINGE
PREFILLED_SYRINGE | INTRAVENOUS | Status: AC
  Filled 2020-09-28: qty 10

## 2020-09-28 MED ORDER — LIDOCAINE HCL (PF) 2 % IJ SOLN
INTRAMUSCULAR | Status: AC
  Filled 2020-09-28: qty 5

## 2020-09-28 MED ORDER — DIPHENHYDRAMINE HCL 50 MG/ML IJ SOLN
INTRAMUSCULAR | Status: AC
  Filled 2020-09-28: qty 1

## 2020-09-28 MED ORDER — DEXAMETHASONE SODIUM PHOSPHATE 10 MG/ML IJ SOLN
INTRAMUSCULAR | Status: AC
  Filled 2020-09-28: qty 1

## 2020-09-28 MED ORDER — ROCURONIUM BROMIDE 100 MG/10ML IV SOLN
INTRAVENOUS | Status: DC | PRN
  Administered 2020-09-28: 20 mg via INTRAVENOUS

## 2020-09-28 MED ORDER — FENTANYL CITRATE (PF) 100 MCG/2ML IJ SOLN
INTRAMUSCULAR | Status: AC
  Filled 2020-09-28: qty 2

## 2020-09-28 MED ORDER — TRAMADOL HCL 50 MG PO TABS: 50.0000 mg | ORAL_TABLET | Freq: Once | ORAL | Status: AC

## 2020-09-28 MED ORDER — PHENYLEPHRINE HCL (PRESSORS) 10 MG/ML IV SOLN
INTRAVENOUS | Status: DC | PRN
  Administered 2020-09-28 (×6): 100 ug via INTRAVENOUS

## 2020-09-28 MED ORDER — SUGAMMADEX SODIUM 200 MG/2ML IV SOLN
INTRAVENOUS | Status: DC | PRN
  Administered 2020-09-28: 200 mg via INTRAVENOUS

## 2020-09-28 MED ORDER — LIDOCAINE HCL (CARDIAC) PF 100 MG/5ML IV SOSY
PREFILLED_SYRINGE | INTRAVENOUS | Status: DC | PRN
  Administered 2020-09-28: 60 mg via INTRAVENOUS

## 2020-09-28 MED ORDER — 0.9 % SODIUM CHLORIDE (POUR BTL) OPTIME
TOPICAL | Status: DC | PRN
  Administered 2020-09-28 (×2): 1000 mL

## 2020-09-28 SURGICAL SUPPLY — 57 items
ADH SKN CLS APL DERMABOND .7 (GAUZE/BANDAGES/DRESSINGS) ×1
AGENT HMST KT MTR STRL THRMB (HEMOSTASIS) ×1
APL PRP STRL LF DISP 70% ISPRP (MISCELLANEOUS) ×2
BUR NEURO DRILL SOFT 3.0X3.8M (BURR) ×2 IMPLANT
CHLORAPREP W/TINT 26 (MISCELLANEOUS) ×4 IMPLANT
CNTNR SPEC 2.5X3XGRAD LEK (MISCELLANEOUS) ×1
CONT SPEC 4OZ STER OR WHT (MISCELLANEOUS) ×1
CONT SPEC 4OZ STRL OR WHT (MISCELLANEOUS) ×1
CONTAINER SPEC 2.5X3XGRAD LEK (MISCELLANEOUS) ×1 IMPLANT
COUNTER NEEDLE 20/40 LG (NEEDLE) ×2 IMPLANT
CUP MEDICINE 2OZ PLAST GRAD ST (MISCELLANEOUS) ×4 IMPLANT
DERMABOND ADVANCED (GAUZE/BANDAGES/DRESSINGS) ×1
DERMABOND ADVANCED .7 DNX12 (GAUZE/BANDAGES/DRESSINGS) ×1 IMPLANT
DRAPE C ARM PK CFD 31 SPINE (DRAPES) ×2 IMPLANT
DRAPE LAPAROTOMY 100X77 ABD (DRAPES) ×2 IMPLANT
DRAPE MICROSCOPE SPINE 48X150 (DRAPES) ×2 IMPLANT
DRAPE SURG 17X11 SM STRL (DRAPES) ×8 IMPLANT
ELECT CAUTERY BLADE TIP 2.5 (TIP) ×2
ELECT EZSTD 165MM 6.5IN (MISCELLANEOUS)
ELECT REM PT RETURN 9FT ADLT (ELECTROSURGICAL) ×2
ELECTRODE CAUTERY BLDE TIP 2.5 (TIP) ×1 IMPLANT
ELECTRODE EZSTD 165MM 6.5IN (MISCELLANEOUS) IMPLANT
ELECTRODE REM PT RTRN 9FT ADLT (ELECTROSURGICAL) ×1 IMPLANT
GAUZE 4X4 16PLY ~~LOC~~+RFID DBL (SPONGE) ×2 IMPLANT
GLOVE SURG SYN 6.5 ES PF (GLOVE) ×4 IMPLANT
GLOVE SURG SYN 6.5 PF PI (GLOVE) ×2 IMPLANT
GLOVE SURG SYN 8.5  E (GLOVE) ×6
GLOVE SURG SYN 8.5 E (GLOVE) ×3 IMPLANT
GLOVE SURG SYN 8.5 PF PI (GLOVE) ×3 IMPLANT
GLOVE SURG UNDER POLY LF SZ6.5 (GLOVE) ×2 IMPLANT
GOWN SRG LRG LVL 4 IMPRV REINF (GOWNS) ×1 IMPLANT
GOWN SRG XL LVL 3 NONREINFORCE (GOWNS) ×1 IMPLANT
GOWN STRL NON-REIN TWL XL LVL3 (GOWNS) ×2
GOWN STRL REIN LRG LVL4 (GOWNS) ×2
GRADUATE 1200CC STRL 31836 (MISCELLANEOUS) ×2 IMPLANT
KIT SPINAL PRONEVIEW (KITS) ×2 IMPLANT
MANIFOLD NEPTUNE II (INSTRUMENTS) ×2 IMPLANT
MARKER SKIN DUAL TIP RULER LAB (MISCELLANEOUS) ×3 IMPLANT
NDL SAFETY ECLIPSE 18X1.5 (NEEDLE) ×1 IMPLANT
NEEDLE HYPO 18GX1.5 SHARP (NEEDLE) ×2
NEEDLE HYPO 22GX1.5 SAFETY (NEEDLE) ×2 IMPLANT
NS IRRIG 1000ML POUR BTL (IV SOLUTION) ×2 IMPLANT
PACK LAMINECTOMY NEURO (CUSTOM PROCEDURE TRAY) ×2 IMPLANT
PAD ARMBOARD 7.5X6 YLW CONV (MISCELLANEOUS) ×2 IMPLANT
SLEEVE PROTECTION STRL DISP (MISCELLANEOUS) ×1 IMPLANT
SURGIFLO W/THROMBIN 8M KIT (HEMOSTASIS) ×2 IMPLANT
SUT DVC VLOC 3-0 CL 6 P-12 (SUTURE) ×2 IMPLANT
SUT VIC AB 0 CT1 27 (SUTURE) ×2
SUT VIC AB 0 CT1 27XCR 8 STRN (SUTURE) ×1 IMPLANT
SUT VIC AB 2-0 CT1 18 (SUTURE) ×2 IMPLANT
SYR 10ML LL (SYRINGE) ×2 IMPLANT
SYR 20ML LL LF (SYRINGE) ×2 IMPLANT
SYR 30ML LL (SYRINGE) ×4 IMPLANT
SYR 3ML LL SCALE MARK (SYRINGE) ×2 IMPLANT
TOWEL OR 17X26 4PK STRL BLUE (TOWEL DISPOSABLE) ×6 IMPLANT
TUBING CONNECTING 10 (TUBING) ×2 IMPLANT
WATER STERILE IRR 500ML POUR (IV SOLUTION) ×2 IMPLANT

## 2020-09-28 NOTE — Transfer of Care (Signed)
Immediate Anesthesia Transfer of Care Note  Patient: Nichole Sanchez  Procedure(s) Performed: L4-5 DECOMPRESSION  Patient Location: PACU  Anesthesia Type:General  Level of Consciousness: drowsy and patient cooperative  Airway & Oxygen Therapy: Patient Spontanous Breathing and Patient connected to face mask oxygen  Post-op Assessment: Report given to RN and Post -op Vital signs reviewed and stable  Post vital signs: Reviewed and stable  Last Vitals:  Vitals Value Taken Time  BP 100/48 09/28/20 1113  Temp 36 C 09/28/20 1113  Pulse 73 09/28/20 1114  Resp 13 09/28/20 1114  SpO2 98 % 09/28/20 1114  Vitals shown include unvalidated device data.  Last Pain:  Vitals:   09/28/20 0828  TempSrc: Temporal  PainSc: 4          Complications: No notable events documented.

## 2020-09-28 NOTE — H&P (Signed)
I have reviewed and confirmed my history and physical from 09/08/2020 with no additions or changes. Plan for L4-5 decompression.  Risks and benefits reviewed.  Heart sounds normal no MRG. Chest Clear to Auscultation Bilaterally.

## 2020-09-28 NOTE — Progress Notes (Signed)
Pt was able to ambulate to the bathroom, voided, and ate crackers and drank some water with out difficulties before discharged home. Continue to monitor.

## 2020-09-28 NOTE — Anesthesia Preprocedure Evaluation (Signed)
Anesthesia Evaluation  Patient identified by MRN, date of birth, ID band Patient awake  General Assessment Comment:  Allergy to propylene glycol. Has not had problems with anesthesia/propofol before  Reviewed: Allergy & Precautions, NPO status , Patient's Chart, lab work & pertinent test results  History of Anesthesia Complications Negative for: history of anesthetic complications  Airway Mallampati: II  TM Distance: >3 FB Neck ROM: Full    Dental  (+) Partial Lower, Upper Dentures   Pulmonary asthma , neg sleep apnea, neg COPD, Patient abstained from smoking.Not current smoker, former smoker,    Pulmonary exam normal breath sounds clear to auscultation       Cardiovascular Exercise Tolerance: Good METShypertension, + CAD and + Cardiac Stents  (-) Past MI (-) dysrhythmias  Rhythm:Regular Rate:Normal - Systolic murmurs    Neuro/Psych  Headaches,  Neuromuscular disease negative psych ROS   GI/Hepatic GERD  ,(+)     (-) substance abuse  ,   Endo/Other  neg diabetesHypothyroidism   Renal/GU negative Renal ROS     Musculoskeletal   Abdominal   Peds  Hematology   Anesthesia Other Findings Past Medical History: No date: Arthritis No date: Asthma     Comment:  well controlled-no inhalers No date: Bronchitis No date: Carpal tunnel syndrome No date: Coronary artery disease     Comment:  a.) LHC 03/19/2017 --> 99% mRCA, 99% dRCA; 2.50 x 18 mm               Xience Sierra DES x 1 to [facility]; distal lesion treated               medically. No date: Diverticulosis No date: Dyspnea No date: GERD (gastroesophageal reflux disease) No date: Hemorrhoids     Comment:  a.) s/p banding in 2011 No date: History of colon polyps No date: Hyperlipidemia No date: Hypertension No date: Hypothyroidism No date: Lumbar stenosis with neurogenic claudication No date: Migraines     Comment:  in the 1980s No date: Pneumonia No date:  Valvular regurgitation     Comment:  a.)  TTE 06/19/2019 --> LVEF >55%; mild MR; trivial AR,               TR, PR.  Reproductive/Obstetrics                             Anesthesia Physical Anesthesia Plan  ASA: 2  Anesthesia Plan: General   Post-op Pain Management:    Induction: Intravenous  PONV Risk Score and Plan: 4 or greater and Ondansetron, Dexamethasone and Treatment may vary due to age or medical condition  Airway Management Planned: Oral ETT  Additional Equipment: None  Intra-op Plan:   Post-operative Plan: Extubation in OR  Informed Consent: I have reviewed the patients History and Physical, chart, labs and discussed the procedure including the risks, benefits and alternatives for the proposed anesthesia with the patient or authorized representative who has indicated his/her understanding and acceptance.     Dental advisory given  Plan Discussed with: CRNA and Surgeon  Anesthesia Plan Comments: (Discussed risks of anesthesia with patient, including PONV, sore throat, lip/dental damage. Rare risks discussed as well, such as cardiorespiratory and neurological sequelae, and allergic reactions. Patient understands.)        Anesthesia Quick Evaluation

## 2020-09-28 NOTE — Discharge Summary (Signed)
Physician Discharge Summary  Patient ID: Nichole Sanchez MRN: [id number] DOB/AGE: 05-15-1940 80 y.o.  Admit date: 09/28/2020 Discharge date: 09/28/2020  Admission Diagnoses: lumbar radiculopathy   Discharge Diagnoses:  Active Problems:   * No active hospital problems. *   Discharged Condition: good  Hospital Course: Nichole Sanchez is a 80 y.o presenting for lumbar radiculopathy and spinal stenosis. Her interoperative course was uncomplicated. She was monitored in the PACU and discharged home after she was able to urinate, ambulate and tolerate PO intake.   Consults: None  Significant Diagnostic Studies: None  Treatments: surgery: L4-5 decompression  Discharge Exam: Blood pressure (!) 156/66, pulse 89, temperature (!) 97 F (36.1 C), temperature source Temporal, resp. rate 17, height 5\' 2"  (1.575 m), weight 80.3 kg, SpO2 98 %. A&O x 3 CN II-XII grossly intact 5/5 throughout BLE  Disposition: Discharge disposition: 01-Home or Self Care        Allergies as of 09/28/2020       Reactions   Propylene Glycol Rash, Other (See Comments)   blisters   Tricor [fenofibrate] Shortness Of Breath   Increases blood pressure   Tussin Cough [dextromethorphan Hbr] Rash, Other (See Comments)   Propylene glychol Increases blood pressure   Advair Diskus [fluticasone-salmeterol] Other (See Comments)   blisters   Albuterol Sulfate Swelling   Benzalkonium Chloride Other (See Comments)   Unknown   Cyclobenzaprine Other (See Comments)   Unknown   Hctz [hydrochlorothiazide] Other (See Comments)   blisters   Neosporin [neomycin-bacitracin Zn-polymyx] Other (See Comments)   blisters   Nickel Other (See Comments)   Itching, blister and raw rash   Prednisone Other (See Comments)   Can not take in large doses   Proair Hfa [albuterol] Swelling   Tylenol [acetaminophen] Swelling, Other (See Comments)   Cannot take with any other medications   Codeine Nausea And Vomiting, Rash    Esomeprazole Rash, Other (See Comments)   Writing on pill has propylene glychol   Etodolac Palpitations   Latex Rash   Naproxen Palpitations   Nystatin Rash   Oxycodone-acetaminophen Rash   Polysporin [bacitracin-polymyxin B] Rash   Tape Rash   Tussionex Pennkinetic Er [hydrocod Polst-cpm Polst Er] Rash        Medication List     STOP taking these medications    aspirin EC 81 MG tablet       TAKE these medications    acetaminophen 500 MG tablet Commonly known as: TYLENOL Take 1,000 mg by mouth every 6 (six) hours as needed for moderate pain or headache.   amLODipine 2.5 MG tablet Commonly known as: NORVASC Take 2.5 mg by mouth at bedtime.   candesartan 32 MG tablet Commonly known as: ATACAND Take 32 mg by mouth daily.   Fish Oil 1000 MG Caps Take 1,000 mg by mouth 2 (two) times daily.   levothyroxine 88 MCG tablet Commonly known as: SYNTHROID Take 88 mcg by mouth daily before breakfast.   loratadine 10 MG tablet Commonly known as: CLARITIN Take 10 mg by mouth in the morning.   meclizine 25 MG tablet Commonly known as: ANTIVERT Take 25 mg by mouth 3 (three) times daily as needed for dizziness.   meloxicam 15 MG tablet Commonly known as: MOBIC Take 15 mg by mouth in the morning.   methocarbamol 500 MG tablet Commonly known as: ROBAXIN Take 1,000 mg by mouth daily as needed (Back pain).   montelukast 10 MG tablet Commonly known as: SINGULAIR Take 10 mg by mouth  at bedtime.   pantoprazole 20 MG tablet Commonly known as: PROTONIX Take 20 mg by mouth daily.   Pfizer-BioNT COVID-19 Vac-TriS Susp injection Generic drug: COVID-19 mRNA Vac-TriS (Pfizer) Inject into the muscle.   rosuvastatin 5 MG tablet Commonly known as: CRESTOR Take 5 mg by mouth daily.   SUPER B COMPLEX PO Take 1 tablet by mouth daily with lunch.   traMADol 50 MG tablet Commonly known as: Ultram Take 1 tablet (50 mg total) by mouth every 6 (six) hours as needed for up to 5  days. What changed:  how much to take when to take this reasons to take this   Vitamin D 50 MCG (2000 UT) Caps Take 2,000 Units by mouth daily.   vitamin E 180 MG (400 UNITS) capsule Take 400 Units by mouth daily.        Follow-up Information     Loleta Dicker, PA Follow up in 2 week(s).   Why: For incision check Contact information: [place] [address] [phone]                 Signed: Loleta Dicker 09/28/2020, 11:09 AM

## 2020-09-28 NOTE — Discharge Instructions (Addendum)
Your surgeon has performed an operation on your lumbar spine (low back) to relieve pressure on one or more nerves. Many times, patients feel better immediately after surgery and can "overdo it." Even if you feel well, it is important that you follow these activity guidelines. If you do not let your back heal properly from the surgery, you can increase the chance of a disc herniation and/or return of your symptoms. The following are instructions to help in your recovery once you have been discharged from the hospital.  * Do not take anti-inflammatory medications for 3 days after surgery (naproxen [Aleve], ibuprofen [Advil, Motrin], celecoxib [Celebrex], etc.) *Hold aspirin for 7 days post-op  Activity    No bending, lifting, or twisting ("BLT"). Avoid lifting objects heavier than 10 pounds (gallon milk jug).  Where possible, avoid household activities that involve lifting, bending, pushing, or pulling such as laundry, vacuuming, grocery shopping, and childcare. Try to arrange for help from friends and family for these activities while your back heals.  Increase physical activity slowly as tolerated.  Taking short walks is encouraged, but avoid strenuous exercise. Do not jog, run, bicycle, lift weights, or participate in any other exercises unless specifically allowed by your doctor. Avoid prolonged sitting, including car rides.  Talk to your doctor before resuming sexual activity.  You should not drive until cleared by your doctor.  Until released by your doctor, you should not return to work or school.  You should rest at home and let your body heal.   You may shower two days after your surgery.  After showering, lightly dab your incision dry. Do not take a tub bath or go swimming for 3 weeks, or until approved by your doctor at your follow-up appointment.  If you smoke, we strongly recommend that you quit.  Smoking has been proven to interfere with normal healing in your back and will  dramatically reduce the success rate of your surgery. Please contact [facility] (800-QUIT-NOW) and use the resources at www.[facility].com for assistance in stopping smoking.  Surgical Incision   If you have a dressing on your incision, you may remove it three days after your surgery. Keep your incision area clean and dry.  If you have staples or stitches on your incision, you should have a follow up scheduled for removal. If you do not have staples or stitches, you will have steri-strips (small pieces of surgical tape) or Dermabond glue. The steri-strips/glue should begin to peel away within about a week (it is fine if the steri-strips fall off before then). If the strips are still in place one week after your surgery, you may gently remove them.  Diet            You may return to your usual diet. Be sure to stay hydrated.  When to Contact us  Although your surgery and recovery will likely be uneventful, you may have some residual numbness, aches, and pains in your back and/or legs. This is normal and should improve in the next few weeks.  However, should you experience any of the following, contact us immediately: New numbness or weakness Pain that is progressively getting worse, and is not relieved by your pain medications or rest Bleeding, redness, swelling, pain, or drainage from surgical incision Chills or flu-like symptoms Fever greater than 101.0 F (38.3 C) Problems with bowel or bladder functions Difficulty breathing or shortness of breath Warmth, tenderness, or swelling in your calf  Contact Information During office hours (Monday-Friday 9 am to  5 pm), please call your physician at [phone] After hours and weekends, please call [phone] and speak with the answering service, who will contact the doctor on call.  If that fails, call the [facility] Operator at [phone] and ask for the Neurosurgery Resident On Call  For a life-threatening emergency, call [place]   The drugs that you were given will stay in your system until tomorrow so for the next 24 hours you should not:  Drive an automobile Make any legal decisions Drink any alcoholic beverage   You may resume regular meals tomorrow.  Today it is better to start with liquids and gradually work up to solid foods.  You may eat anything you prefer, but it is better to start with liquids, then soup and crackers, and gradually work up to solid foods.   Please notify your doctor immediately if you have any unusual bleeding, trouble breathing, redness and pain at the surgery site, drainage, fever, or pain not relieved by medication.     Your post-operative visit with Dr.                                       is: Date:                        Time:    Please call to schedule your post-operative visit.  Additional Instructions:

## 2020-09-28 NOTE — Anesthesia Procedure Notes (Signed)
Procedure Name: Intubation Date/Time: 09/28/2020 9:31 AM Performed by: Guadlupe Spanish, RN Pre-anesthesia Checklist: Patient identified, Patient being monitored, Timeout performed, Emergency Drugs available and Suction available Patient Re-evaluated:Patient Re-evaluated prior to induction Oxygen Delivery Method: Circle system utilized Preoxygenation: Pre-oxygenation with 100% oxygen Induction Type: IV induction Ventilation: Mask ventilation without difficulty Laryngoscope Size: 3 and McGraph Grade View: Grade I Tube type: Oral Tube size: 6.5 mm Number of attempts: 1 Airway Equipment and Method: Stylet, Oral airway and Video-laryngoscopy Placement Confirmation: ETT inserted through vocal cords under direct vision, positive ETCO2 and breath sounds checked- equal and bilateral Secured at: 21 cm Tube secured with: Tape Dental Injury: Teeth and Oropharynx as per pre-operative assessment

## 2020-09-28 NOTE — Anesthesia Postprocedure Evaluation (Signed)
Anesthesia Post Note  Patient: Nichole Sanchez  Procedure(s) Performed: L4-5 DECOMPRESSION  Patient location during evaluation: PACU Anesthesia Type: General Level of consciousness: awake and alert Pain management: pain level controlled Vital Signs Assessment: post-procedure vital signs reviewed and stable Respiratory status: spontaneous breathing, nonlabored ventilation, respiratory function stable and patient connected to nasal cannula oxygen Cardiovascular status: blood pressure returned to baseline and stable Postop Assessment: no apparent nausea or vomiting Anesthetic complications: no   No notable events documented.   Last Vitals:  Vitals:   09/28/20 1212 09/28/20 1222  BP: (!) 148/61 (!) 130/96  Pulse: 76 73  Resp: 13 16  Temp: (!) 36 C (!) 35.9 C  SpO2: 98% 100%    Last Pain:  Vitals:   09/28/20 1222  TempSrc: Temporal  PainSc: 7                  Arita Miss

## 2020-09-28 NOTE — Op Note (Signed)
Indications: Nichole Sanchez is a 80 yo female who presented with severe lumbar stenosis and neurogenic claudication.    Findings: severe stenosis  Preoperative Diagnosis: Lumbar Stenosis with neurogenic claudication Postoperative Diagnosis: same   EBL: 10 ml IVF: see AR ml Drains: none Disposition: Extubated and Stable to PACU Complications: none  No foley catheter was placed.   Preoperative Note:   Risks of surgery discussed include: infection, bleeding, stroke, coma, death, paralysis, CSF leak, nerve/spinal cord injury, numbness, tingling, weakness, complex regional pain syndrome, recurrent stenosis and/or disc herniation, vascular injury, development of instability, neck/back pain, need for further surgery, persistent symptoms, development of deformity, and the risks of anesthesia. The patient understood these risks and agreed to proceed.  Operative Note:   1. L4-5 lumbar decompression including central laminectomy and bilateral medial facetectomies including foraminotomies  The patient was then brought from the preoperative center with intravenous access established.  The patient underwent general anesthesia and endotracheal tube intubation, and was then rotated on the Ty Ty rail top where all pressure points were appropriately padded.  The skin was then thoroughly cleansed.  Perioperative antibiotic prophylaxis was administered.  Sterile prep and drapes were then applied and a timeout was then observed.  C-arm was brought into the field under sterile conditions and under lateral visualization the L4-5 interspace was identified and marked.  The incision was marked on the left and injected with local anesthetic. Once this was complete a 3 cm incision was opened with the use of a #10 blade knife.    The metrx tubes were sequentially advanced and confirmed in position at L4-5. An 38mm by 56mm tube was locked in place to the bed side attachment.  The microscope was then sterilely brought  into the field and muscle creep was hemostased with a bipolar and resected with a pituitary rongeur.  A Bovie extender was then used to expose the spinous process and lamina.  Careful attention was placed to not violate the facet capsule. A 3 mm matchstick drill bit was then used to make a hemi-laminotomy trough until the ligamentum flavum was exposed.  This was extended to the base of the spinous process and to the contralateral side to remove all the central bone from each side.  Once this was complete and the underlying ligamentum flavum was visualized, it was dissected with a curette and resected with Kerrison rongeurs.  Extensive ligamentum hypertrophy was noted, requiring a substantial amount of time and care for removal.  The dura was identified and palpated. The kerrison rongeur was then used to remove the medial facet bilaterally until no compression was noted.  A balltip probe was used to confirm decompression of the ipsilateral L5 nerve root.  Additional attention was paid to completion of the contralateral L4-5 foraminotomy until the contralateral traversing nerve root was completely free.  Once this was complete, L4-5 central decompression including medial facetectomy and foraminotomy was confirmed and decompression on both sides was confirmed. No CSF leak was noted.  A Depo-Medrol soaked Gelfoam pledget was placed in the defect.  The wound was copiously irrigated. The tube system was then removed under microscopic visualization and hemostasis was obtained with a bipolar.    The fascial layer was reapproximated with the use of a 0 Vicryl suture.  Subcutaneous tissue layer was reapproximated using 2-0 Vicryl suture.  3-0 monocryl was placed in subcuticular fashion. The skin was then cleansed and Dermabond was used to close the skin opening.  Patient was then rotated back to the preoperative  bed awakened from anesthesia and taken to recovery all counts are correct in this case.  I performed the  entire procedure with the assistance of Nichole Render PA as an Pensions consultant.  Adelfa Lozito K. Izora Ribas MD

## 2021-09-14 ENCOUNTER — Telehealth: Payer: Self-pay

## 2021-09-14 NOTE — Telephone Encounter (Signed)
-----   Message from Peggyann Shoals sent at 09/14/2021  3:39 PM EDT ----- Regarding: needs appt  09/28/20 L4-5 decompression with Dr. Izora Ribas  This patient seen Dr. Karel Jarvis at [facility] today for her left knee. He wants her to have a total knee replacement the last week of September. She is having right hip pain and was told to see Dr.Yarbrough before her replacement because it might be a pinched nerve. Marzetta Board is booked out to 10/5. Patient did mention maybe needing an injection. I told her that we don't do that here. She said that she wants to see Dr.Yarbrough because he helped her last time.

## 2021-09-14 NOTE — Telephone Encounter (Signed)
Dr Darreld Mclean said you can use a new pt slot next week or a 4pm slot

## 2021-09-15 NOTE — Telephone Encounter (Signed)
She confirmed appt for 09/21/2021

## 2021-09-18 ENCOUNTER — Other Ambulatory Visit: Payer: Self-pay | Admitting: Orthopedic Surgery

## 2021-09-21 ENCOUNTER — Encounter: Payer: Self-pay | Admitting: Neurosurgery

## 2021-09-21 ENCOUNTER — Ambulatory Visit (INDEPENDENT_AMBULATORY_CARE_PROVIDER_SITE_OTHER): Payer: Medicare Other | Admitting: Neurosurgery

## 2021-09-21 ENCOUNTER — Other Ambulatory Visit: Payer: Medicare Other

## 2021-09-21 VITALS — BP 134/76 | Ht 62.0 in | Wt 180.6 lb

## 2021-09-21 DIAGNOSIS — M5416 Radiculopathy, lumbar region: Secondary | ICD-10-CM

## 2021-09-21 NOTE — Progress Notes (Signed)
Referring Physician:  Tracie Harrier, [facility] [place],  [place] [address]  Primary Physician:  Tracie Harrier, MD  DOS: 09/28/20 L4-5 decompression   History of Present Illness: 09/21/2021 [name] is here today with a chief complaint of pain in her right buttock and down her leg.  She began having some mild back pain around the start of 2023.  Her left leg is still feeling very well.  She had left leg pain prior to her surgery a year ago.  She is now having discomfort in her right buttock and posterior lateral thigh.  She has some discomfort on her anterolateral calf.  Of note, she is having her left knee replaced next week.  11/10/20 Nichole Sanchez is status post lumbar decompression. She is doing very well. She has minimal leg pain. She has occasional discomfort when she drives. Overall, she is much improved.   Review of Systems:  A 10 point review of systems is negative, except for the pertinent positives and negatives detailed in the HPI.  Past Medical History: Past Medical History:  Diagnosis Date   Arthritis    Asthma    well controlled-no inhalers   Bronchitis    Carpal tunnel syndrome    Coronary artery disease    a.) LHC 03/19/2017 --> 99% mRCA, 99% dRCA; 2.50 x 18 mm Xience Sierra DES x 1 to [facility]; distal lesion treated medically.   Diverticulosis    Dyspnea    GERD (gastroesophageal reflux disease)    Hemorrhoids    a.) s/p banding in 2011   History of colon polyps    Hyperlipidemia    Hypertension    Hypothyroidism    Lumbar stenosis with neurogenic claudication    Migraines    in the 1980s   Pneumonia    Valvular regurgitation    a.)  TTE 06/19/2019 --> LVEF >55%; mild MR; trivial AR, TR, PR.    Past Surgical History: Past Surgical History:  Procedure Laterality Date   APPENDECTOMY N/A    CARPAL TUNNEL RELEASE Right 06/25/2016   Procedure: CARPAL TUNNEL RELEASE;  Surgeon: Earnestine Leys, MD;   Location: [facility] ORS;  Service: Orthopedics;  Laterality: Right;   CATARACT EXTRACTION W/PHACO Right 08/02/2014   Procedure: CATARACT EXTRACTION PHACO AND INTRAOCULAR LENS PLACEMENT (IOC);  Surgeon: Estill Cotta, MD;  Location: [facility] ORS;  Service: Ophthalmology;  Laterality: Right;  [place]:  01:30.3 AP%: 16.4 CDE: 28.22  Fluid Lot# [id number]   CATARACT EXTRACTION W/PHACO Left 08/23/2014   Procedure: CATARACT EXTRACTION PHACO AND INTRAOCULAR LENS PLACEMENT (IOC);  Surgeon: Estill Cotta, MD;  Location: [facility] ORS;  Service: Ophthalmology;  Laterality: Left;  [place] 01:11.4 AP% 23.0 CDE 28.17 FLUID LOT# [id number]   CHOLECYSTECTOMY N/A 2011   COLONOSCOPY N/A 02/23/2009   COLONOSCOPY WITH PROPOFOL N/A 09/19/2016   Procedure: COLONOSCOPY WITH PROPOFOL;  Surgeon: Manya Silvas, MD;  Location: [facility] ENDOSCOPY;  Service: Endoscopy;  Laterality: N/A;   CORONARY STENT INTERVENTION N/A 03/12/2017   Procedure: CORONARY STENT INTERVENTION;  Surgeon: Isaias Cowman, MD;  Location: [place] CV LAB;  Service: Cardiovascular;  Laterality: N/A;   DILATION AND CURETTAGE OF UTERUS     ESOPHAGOGASTRODUODENOSCOPY N/A 02/23/2009   ESOPHAGOGASTRODUODENOSCOPY N/A 03/31/2012   ESOPHAGOGASTRODUODENOSCOPY N/A 11/24/1999   ESOPHAGOGASTRODUODENOSCOPY (EGD) WITH PROPOFOL N/A 07/28/2019   Procedure: ESOPHAGOGASTRODUODENOSCOPY (EGD) WITH PROPOFOL;  Surgeon: Lesly Rubenstein, MD;  Location: [facility] ENDOSCOPY;  Service: Endoscopy;  Laterality: N/A;   EYE SURGERY Bilateral 06/2016  LASER SURGERY "FOR CLOUDY EYES"   HEMORRHOID BANDING N/A 2011   KNEE ARTHROSCOPY Left 2014   LEFT HEART CATH AND CORONARY ANGIOGRAPHY Left 03/12/2017   Procedure: LEFT HEART CATH AND CORONARY ANGIOGRAPHY;  Surgeon: Teodoro Spray, MD;  Location: [place] [facility];  Service: Cardiovascular;  Laterality: Left;   LUMBAR LAMINECTOMY/DECOMPRESSION MICRODISCECTOMY N/A 09/28/2020   Procedure: L4-5 DECOMPRESSION;  Surgeon: Meade Maw, MD;  Location: [facility] ORS;  Service: Neurosurgery;  Laterality: N/A;   TONSILLECTOMY N/A 1963   TOTAL KNEE ARTHROPLASTY Right    TRIGGER FINGER RELEASE Left    VAGINAL HYSTERECTOMY N/A 1986   WRIST JOINT AND TENDON TRANSPLANT     late 1990s    Allergies: Allergies as of 09/21/2021 - Review Complete 09/21/2021  Allergen Reaction Noted   Propylene glycol Rash and Other (See Comments) 07/30/2014   Tricor [fenofibrate] Shortness Of Breath 08/01/2020   Tussin cough [dextromethorphan hbr] Rash and Other (See Comments) 08/02/2014   Advair diskus [fluticasone-salmeterol] Other (See Comments) 06/20/2016   Albuterol sulfate Swelling 06/20/2016   Benzalkonium chloride Other (See Comments) 06/20/2016   Cyclobenzaprine Other (See Comments) 07/30/2014   Hctz [hydrochlorothiazide] Other (See Comments) 06/20/2016   Neosporin [neomycin-bacitracin zn-polymyx] Other (See Comments) 07/30/2014   Nickel Other (See Comments) 09/14/2020   Prednisone Other (See Comments) 07/30/2014   Proair hfa [albuterol] Swelling 09/18/2016   Codeine Nausea And Vomiting and Rash 07/30/2014   Esomeprazole Rash and Other (See Comments) 06/20/2016   Etodolac Palpitations 06/20/2016   Latex Rash 06/20/2016   Naproxen Palpitations 07/30/2014   Nystatin Rash 06/20/2016   Oxycodone-acetaminophen Rash 06/20/2016   Polysporin [bacitracin-polymyxin b] Rash 07/30/2014   Tape Rash 06/20/2016   Tussionex pennkinetic er [hydrocod poli-chlorphe poli er] Rash 09/18/2016    Medications: No outpatient medications have been marked as taking for the 09/21/21 encounter (Office Visit) with Meade Maw, MD.    Social History: Social History   Tobacco Use   Smoking status: Former    Types: Cigarettes    Quit date: 1976    Years since quitting: 47.7   Smokeless tobacco: Never  Vaping Use   Vaping Use: Never used  Substance Use Topics   Alcohol use: No   Drug use: No    Family Medical History: No family history  on file.  Physical Examination: Vitals:   09/21/21 1013  BP: 134/76    General: Patient is well developed, well nourished, calm, collected, and in no apparent distress. Attention to examination is appropriate.  Neck:   Supple.  Full range of motion.  Respiratory: Patient is breathing without any difficulty.   NEUROLOGICAL:     Awake, alert, oriented to person, place, and time.  Speech is clear and fluent. Fund of knowledge is appropriate.   Cranial Nerves: Pupils equal round and reactive to light.  Facial tone is symmetric.  Facial sensation is symmetric. Shoulder shrug is symmetric. Tongue protrusion is midline.  There is no pronator drift.  ROM of spine: full.    Strength: Side Biceps Triceps Deltoid Interossei Grip Wrist Ext. Wrist Flex.  R '5 5 5 5 5 5 5  '$ L '5 5 5 5 5 5 5   '$ Side Iliopsoas Quads Hamstring PF DF EHL  R '5 5 5 5 5 5  '$ L '5 5 5 5 5 5   '$ Reflexes are 1+ and symmetric at the biceps, triceps, brachioradialis, patella and achilles.   Hoffman's is absent.  Clonus is not present.  Toes are down-going.  Bilateral upper and lower extremity sensation is intact to light touch.    No evidence of dysmetria noted.  Gait is normal.    Medical Decision Making  Imaging: No new imaging to review.  Her old imaging was reviewed.  I have personally reviewed the images and agree with the above interpretation.  Assessment and Plan: [name] is a pleasant 81 y.o. female with right leg symptoms concerning for recurrent L5 radiculopathy.  Her left sided radiculopathy solved after surgery.  She is having her left knee replaced next week.  At this point, I recommended that she move forward with that.  I would suggest she do physical therapy for her back while she is recuperating from her left knee surgery.  She cannot have epidural steroid injections this close to a total knee arthroplasty.  I will see her back in 2 months after she is recovered from her knee arthroplasty.  We  will reevaluate for imaging and consideration of epidural steroid injections at that time.   I spent a total of 30 minutes in face-to-face and non-face-to-face activities related to this patient's care today.  Thank you for involving me in the care of this patient.      Richardine Peppers K. Izora Ribas MD, [facility] Neurosurgery

## 2021-09-22 ENCOUNTER — Other Ambulatory Visit: Payer: Self-pay

## 2021-09-22 ENCOUNTER — Encounter
Admission: RE | Admit: 2021-09-22 | Discharge: 2021-09-22 | Disposition: A | Discharge status: Home / Self Care | Payer: Medicare Other | Source: Ambulatory Visit | Attending: Orthopedic Surgery | Admitting: Orthopedic Surgery

## 2021-09-22 VITALS — BP 128/67 | HR 74 | Resp 16 | Ht 62.0 in | Wt 182.8 lb

## 2021-09-22 DIAGNOSIS — Z01812 Encounter for preprocedural laboratory examination: Secondary | ICD-10-CM | POA: Diagnosis present

## 2021-09-22 DIAGNOSIS — M1711 Unilateral primary osteoarthritis, right knee: Secondary | ICD-10-CM | POA: Insufficient documentation

## 2021-09-22 HISTORY — DX: Malignant (primary) neoplasm, unspecified: C80.1

## 2021-09-22 HISTORY — DX: Cardiac murmur, unspecified: R01.1

## 2021-09-22 HISTORY — DX: Atherosclerotic heart disease of native coronary artery without angina pectoris: I25.10

## 2021-09-22 HISTORY — DX: Anemia, unspecified: D64.9

## 2021-09-22 LAB — URINALYSIS, ROUTINE W REFLEX MICROSCOPIC
Bacteria, UA: NONE SEEN
Bilirubin Urine: NEGATIVE
Glucose, UA: NEGATIVE mg/dL
Hgb urine dipstick: NEGATIVE
Ketones, ur: NEGATIVE mg/dL
Leukocytes,Ua: NEGATIVE
Nitrite: NEGATIVE
Protein, ur: NEGATIVE mg/dL
Specific Gravity, Urine: <1.005 — ABNORMAL LOW (ref 1.005–1.030)
pH: 5.5 (ref 5.0–8.0)

## 2021-09-22 LAB — SURGICAL PCR SCREEN
MRSA, PCR: NEGATIVE
Staphylococcus aureus: NEGATIVE

## 2021-09-22 LAB — TYPE AND SCREEN
ABO/RH(D): O POS
Antibody Screen: NEGATIVE

## 2021-09-22 NOTE — Patient Instructions (Addendum)
Your procedure is scheduled on: 09/28/21 - Thursday Report to the Registration Desk on the 1st floor of the [facility]. To find out your arrival time, please call [phone] between 1PM - 3PM on: 09/27/21 - Wednesday If your arrival time is 6:00 am, do not arrive prior to that time as the [facility] entrance doors do not open until 6:00 am.  REMEMBER: Instructions that are not followed completely may result in serious medical risk, up to and including death; or upon the discretion of your surgeon and anesthesiologist your surgery may need to be rescheduled.  Do not eat food after midnight the night before surgery.  No gum chewing, lozengers or hard candies.  You may however, drink CLEAR liquids up to 2 hours before you are scheduled to arrive for your surgery. Do not drink anything within 2 hours of your scheduled arrival time.  Clear liquids include: - water  - apple juice without pulp - gatorade (not RED colors) - black coffee or tea (Do NOT add milk or creamers to the coffee or tea) Do NOT drink anything that is not on this list.  Type 1 and Type 2 diabetics should only drink water.  In addition, your doctor has ordered for you to drink the provided  Ensure Pre-Surgery Clear Carbohydrate Drink  Drinking this carbohydrate drink up to two hours before surgery helps to reduce insulin resistance and improve patient outcomes. Please complete drinking 2 hours prior to scheduled arrival time.  TAKE THESE MEDICATIONS THE MORNING OF SURGERY WITH A SIP OF WATER:  - levothyroxine (SYNTHROID)  HOLD ASPIRIN for 5 days prior to surgery.  One week prior to surgery: meloxicam [facility])  Stop Anti-inflammatories (NSAIDS) such as Advil, Aleve, Ibuprofen, Motrin, Naproxen, Naprosyn and Aspirin based products such as Excedrin, Goodys Powder, BC Powder . Stop ANY OVER THE COUNTER supplements until after surgery.  You may however, continue to take Tylenol if needed for pain up until the day  of surgery.  No Alcohol for 24 hours before or after surgery.  No Smoking including e-cigarettes for 24 hours prior to surgery.  No chewable tobacco products for at least 6 hours prior to surgery.  No nicotine patches on the day of surgery.  Do not use any "recreational" drugs for at least a week prior to your surgery.  Please be advised that the combination of cocaine and anesthesia may have negative outcomes, up to and including death. If you test positive for cocaine, your surgery will be cancelled.  On the morning of surgery brush your teeth with toothpaste and water, you may rinse your mouth with mouthwash if you wish. Do not swallow any toothpaste or mouthwash.  Use CHG Soap or wipes as directed on instruction sheet.  Do not wear jewelry, make-up, hairpins, clips or nail polish.  Do not wear lotions, powders, or perfumes.   Do not shave body from the neck down 48 hours prior to surgery just in case you cut yourself which could leave a site for infection.  Also, freshly shaved skin may become irritated if using the CHG soap.  Contact lenses, hearing aids and dentures may not be worn into surgery.  Do not bring valuables to the hospital. [facility] is not responsible for any missing/lost belongings or valuables.   Notify your doctor if there is any change in your medical condition (cold, fever, infection).  Wear comfortable clothing (specific to your surgery type) to the hospital.  After surgery, you can help prevent lung  complications by doing breathing exercises.  Take deep breaths and cough every 1-2 hours. Your doctor may order a device called an Incentive Spirometer to help you take deep breaths. When coughing or sneezing, hold a pillow firmly against your incision with both hands. This is called "splinting." Doing this helps protect your incision. It also decreases belly discomfort.  If you are being admitted to the hospital overnight, leave your suitcase in the  car. After surgery it may be brought to your room.  If you are being discharged the day of surgery, you will not be allowed to drive home. You will need a responsible adult (18 years or older) to drive you home and stay with you that night.   If you are taking public transportation, you will need to have a responsible adult (18 years or older) with you. Please confirm with your physician that it is acceptable to use public transportation.   Please call the [facility] Dept. at [phone] if you have any questions about these instructions.  Surgery Visitation Policy:  Patients undergoing a surgery or procedure may have two family members or support persons with them as long as the person is not COVID-19 positive or experiencing its symptoms.   Inpatient Visitation:    Visiting hours are 7 a.m. to 8 p.m. Up to four visitors are allowed at one time in a patient room, including children. The visitors may rotate out with other people during the day. One designated support person (adult) may remain overnight.

## 2021-09-25 ENCOUNTER — Encounter: Payer: Self-pay | Admitting: Urgent Care

## 2021-09-25 NOTE — Progress Notes (Signed)
Perioperative Services  Pre-Admission/Anesthesia Testing Clinical Review  Date: 09/25/21  Patient Demographics:  Name: Nichole Sanchez DOB:   18-Oct-1940 MRN:   [id number]  Planned Surgical Procedure(s):    Case: [id number] Date/Time: 09/28/21 0700   Procedure: TOTAL KNEE ARTHROPLASTY (Left: Knee)   Anesthesia type: Choice   Pre-op diagnosis:      Localized osteoarthritis of left knee  M17.12     Left knee pain, unspecified chronicity  M25.562   Location: [facility] OR ROOM 01 / [facility]   Surgeons: Reinaldo Berber, MD   NOTE: Available PAT nursing documentation and vital signs have been reviewed. Clinical nursing staff has updated patient's PMH/PSHx, current medication list, and drug allergies/intolerances to ensure comprehensive history available to assist in medical decision making as it pertains to the aforementioned surgical procedure and anticipated anesthetic course. Extensive review of available clinical information performed. [place] PMH and PSHx updated with any diagnoses/procedures that  may have been inadvertently omitted during her intake with the pre-admission testing department's nursing staff.  Clinical Discussion:  Nichole Sanchez is a 81 y.o. female who is submitted for pre-surgical anesthesia review and clearance prior to her undergoing the above procedure. Patient is a Former Smoker (quit 1976). Pertinent PMH includes: CAD, diastolic dysfunction, aortic atherosclerosis, cardiac murmur, HTN, HLD, hypothyroidism, SOB, mild intermittent asthma, GERD (on daily PPI), anemia, OA, lumbar stenosis with neurogenic claudication.  Patient is followed by cardiology Lady Gary, MD). She was last seen in the cardiology clinic on 09/19/2021; notes reviewed.  At the time of her clinic visit, patient reported to be doing well overall from a cardiovascular perspective.  Patient denied any episodes of chest pain, shortness breath, PND, orthopnea, palpitations, or  presyncope/syncope.  Patient with intermittent episodes of position related vertiginous symptoms and peripheral edema, however both symptoms reported to be stable and at baseline.  Patient with a past medical history significant for cardiovascular diagnoses.  Myocardial perfusion imaging study performed on 02/22/2017 revealed an LVEF of 67%.  There were no regional wall motion abnormalities.  Left ventricular cavity size normal.  There was no evidence of significant stress-induced reversible ischemia or arrhythmia.  Study attempted be normal and low risk.  TTE performed on 02/23/2017 revealed an LVEF of >55%.  There was globally normal systolic function with mild mitral and tricuspid valve regurgitation.  There was no evidence of valvular stenosis.  Patient underwent diagnostic left heart catheterization on 03/19/2017 that revealed a normal left ventricular systolic function and LVEDP.  There were 99% stenoses within the mid RCA and distal RCA.  PCI was performed and a 2.50 x 18 mm Xience Sierra DES x 1 was placed to the mid RCA.  Decision was made to treat distal lesion medically.  Most recent noninvasive cardiovascular testing included a TTE (06/19/2019) and a myocardial perfusion imaging study (06/23/2019) that remain grossly unchanged from previous studies.  Blood pressure well controlled at 136/72 mmHg on currently prescribed CCB (amlodipine) and ARB (candesartan) therapies.  Patient is on rosuvastatin + omega-3 fatty acid for her HLD diagnosis and further ASCVD prevention.  She is not diabetic.  Patient does not have an OSAH diagnosis. Functional capacity, as defined by [facility]ASI, is documented as being >/= 4 METS.  No changes were made to her medication regimen.  Patient to follow-up with outpatient cardiology and 6 months or sooner  Nichole Sanchez is scheduled for an elective LEFT TOTAL KNEE ARTHROPLASTY on 09/28/2021 with Dr. Reinaldo Berber, MD.  Given patient's past  medical history significant  for cardiovascular diagnoses, presurgical cardiac clearance was sought by the PAT team. Per cardiology, "this patient is optimized for surgery and may proceed with the planned procedural course with a MODERATE risk of significant perioperative cardiovascular complications".  In review of her medication reconciliation, it is noted the patient is on daily antiplatelet therapy.  She has been instructed on recommendations from her cardiologist for holding her daily low-dose ASA for 5 days prior to her procedure with plans to restart as soon as postoperative bleeding risk to be minimized by her primary attending surgeon.  The patient is aware that her last dose of ASA should be on 09/22/2021.  Patient denies previous perioperative complications with anesthesia in the past. In review of the available records, it is noted that patient underwent a general anesthetic course here at [facility] (ASA II) in 09/2020 without documented complications.      09/22/2021    8:08 AM 09/21/2021   10:13 AM 09/28/2020    1:30 PM  Vitals with BMI  Height 5\' 2"  5\' 2"    Weight 182 lbs 12 oz 180 lbs 10 oz   BMI 33.42 33.02   Systolic 128 134 161  Diastolic 67 76 65  Pulse 74  65    Providers/Specialists:   NOTE: Primary physician provider listed below. Patient may have been seen by APP or partner within same practice.   PROVIDER ROLE / SPECIALTY LAST Wilber Bihari, MD Orthopedics (Surgeon) 09/14/2021  Barbette Reichmann, MD Primary Care Provider 09/12/2021  Harold Hedge, MD Cardiology 09/19/2021   Allergies:  Propylene glycol, Tricor [fenofibrate], Tussin cough [dextromethorphan hbr], Advair diskus [fluticasone-salmeterol], Albuterol sulfate, Benzalkonium chloride, Cyclobenzaprine, Hctz [hydrochlorothiazide], Neosporin [neomycin-bacitracin zn-polymyx], Nickel, Prednisone, Proair hfa [albuterol], Codeine, Esomeprazole, Etodolac, Latex, Naproxen, Nystatin,  Oxycodone-acetaminophen, Polysporin [bacitracin-polymyxin b], Tape, and Tussionex pennkinetic er [hydrocod poli-chlorphe poli er]  Current Home Medications:   No current facility-administered medications for this encounter.    acetaminophen (TYLENOL) 500 MG tablet   alum hydroxide-mag trisilicate (GAVISCON) 80-20 MG CHEW chewable tablet   amLODipine (NORVASC) 2.5 MG tablet   aspirin EC 81 MG tablet   AZO-CRANBERRY PO   B Complex-C (SUPER B COMPLEX PO)   BIOTIN PO   candesartan (ATACAND) 32 MG tablet   Cholecalciferol (VITAMIN D) 2000 units CAPS   levothyroxine (SYNTHROID) 88 MCG tablet   loratadine (CLARITIN) 10 MG tablet   meclizine (ANTIVERT) 25 MG tablet   meloxicam (MOBIC) 15 MG tablet   methocarbamol (ROBAXIN) 500 MG tablet   Omega-3 Fatty Acids (FISH OIL) 1000 MG CAPS   rosuvastatin (CRESTOR) 5 MG tablet   vitamin E 180 MG (400 UNITS) capsule   History:   Past Medical History:  Diagnosis Date   Adenomatous polyps    Anemia    Aortic atherosclerosis (HCC)    Arthritis    Basal cell carcinoma (BCC) of skin of face    Bronchitis    CAD (coronary artery disease)    a.) LHC 03/12/2017: 99% mRCA, 99% post atrio --> PCI placing a 2.5 x 18 mm Xience Sierra DES x 1 to [facility]   Carpal tunnel syndrome    Coronary artery disease    a.) LHC 03/19/2017 --> 99% mRCA, 99% dRCA; 2.50 x 18 mm Xience Sierra DES x 1 to [facility]; distal lesion treated medically.   Diastolic dysfunction 02/23/2017   a.) TTE 02/23/2017: EF >55%, mild LVH, mild MR/TR, G1DD; b.) TTE 06/19/2019: EF >55%, mild LAE, triv  AR/TR/PR, mild MR, G1DD.   Diverticulosis    Dyspnea    GERD (gastroesophageal reflux disease)    Heart murmur    Hemorrhoids    a.) s/p banding in 2011   Hyperlipidemia    Hypertension    Hypothyroidism    Lumbar stenosis with neurogenic claudication    Migraines    Mild intermittent asthma    a.) well controlled; no MDIs   Pneumonia    Past Surgical History:  Procedure Laterality Date    APPENDECTOMY N/A    CARPAL TUNNEL RELEASE Right 06/25/2016   Procedure: CARPAL TUNNEL RELEASE;  Surgeon: Deeann Saint, MD;  Location: [facility] ORS;  Service: Orthopedics;  Laterality: Right;   CATARACT EXTRACTION W/PHACO Right 08/02/2014   Procedure: CATARACT EXTRACTION PHACO AND INTRAOCULAR LENS PLACEMENT (IOC);  Surgeon: Sallee Lange, MD;  Location: [facility] ORS;  Service: Ophthalmology;  Laterality: Right;  [place]:  01:30.3 AP%: 16.4 CDE: 28.22  Fluid Lot# [id number]   CATARACT EXTRACTION W/PHACO Left 08/23/2014   Procedure: CATARACT EXTRACTION PHACO AND INTRAOCULAR LENS PLACEMENT (IOC);  Surgeon: Sallee Lange, MD;  Location: [facility] ORS;  Service: Ophthalmology;  Laterality: Left;  [place] 01:11.4 AP% 23.0 CDE 28.17 FLUID LOT# [id number]   CHOLECYSTECTOMY N/A 2011   COLONOSCOPY N/A 02/23/2009   COLONOSCOPY WITH PROPOFOL N/A 09/19/2016   Procedure: COLONOSCOPY WITH PROPOFOL;  Surgeon: Scot Jun, MD;  Location: [facility] ENDOSCOPY;  Service: Endoscopy;  Laterality: N/A;   CORONARY STENT INTERVENTION N/A 03/12/2017   Procedure: CORONARY STENT INTERVENTION;  Surgeon: Marcina Millard, MD;  Location: [facility] INVASIVE CV LAB;  Service: Cardiovascular;  Laterality: N/A;   DILATION AND CURETTAGE OF UTERUS     ESOPHAGOGASTRODUODENOSCOPY N/A 02/23/2009   ESOPHAGOGASTRODUODENOSCOPY N/A 03/31/2012   ESOPHAGOGASTRODUODENOSCOPY N/A 11/24/1999   ESOPHAGOGASTRODUODENOSCOPY (EGD) WITH PROPOFOL N/A 07/28/2019   Procedure: ESOPHAGOGASTRODUODENOSCOPY (EGD) WITH PROPOFOL;  Surgeon: Regis Bill, MD;  Location: [facility] ENDOSCOPY;  Service: Endoscopy;  Laterality: N/A;   EYE SURGERY Bilateral 06/2016   LASER SURGERY "FOR CLOUDY EYES"   HEMORRHOID BANDING N/A 2011   KNEE ARTHROSCOPY Left 2014   LEFT HEART CATH AND CORONARY ANGIOGRAPHY Left 03/12/2017   Procedure: LEFT HEART CATH AND CORONARY ANGIOGRAPHY;  Surgeon: Dalia Heading, MD;  Location: [facility] INVASIVE CV LAB;  Service: Cardiovascular;  Laterality:  Left;   LUMBAR LAMINECTOMY/DECOMPRESSION MICRODISCECTOMY N/A 09/28/2020   Procedure: L4-5 DECOMPRESSION;  Surgeon: Venetia Night, MD;  Location: [facility] ORS;  Service: Neurosurgery;  Laterality: N/A;   TONSILLECTOMY N/A 1963   TOTAL KNEE ARTHROPLASTY Right    TRIGGER FINGER RELEASE Left    VAGINAL HYSTERECTOMY N/A 1986   WRIST JOINT AND TENDON TRANSPLANT     late 1990s   No family history on file. Social History   Tobacco Use   Smoking status: Former    Types: Cigarettes    Quit date: 1976    Years since quitting: 47.7   Smokeless tobacco: Never  Vaping Use   Vaping Use: Never used  Substance Use Topics   Alcohol use: No   Drug use: No    Pertinent Clinical Results:  LABS: Labs reviewed: Acceptable for surgery.  Hospital Outpatient Visit on 09/22/2021  Component Date Value Ref Range Status   MRSA, PCR 09/22/2021 NEGATIVE  NEGATIVE Final   Staphylococcus aureus 09/22/2021 NEGATIVE  NEGATIVE Final   Comment: (NOTE) The Xpert SA Assay (FDA approved for NASAL specimens in patients 25 years of age and older), is one component of a comprehensive surveillance program. It is not intended  to diagnose infection nor to guide or monitor treatment. Performed at [facility], [address] [place], [place] [address]    Color, Urine 09/22/2021 YELLOW (A)  YELLOW Final   APPearance 09/22/2021 CLEAR  CLEAR Final   Specific Gravity, Urine 09/22/2021 <1.005 (L)  1.005 - 1.030 Final   pH 09/22/2021 5.5  5.0 - 8.0 Final   Glucose, UA 09/22/2021 NEGATIVE  NEGATIVE mg/dL Final   Hgb urine dipstick 09/22/2021 NEGATIVE  NEGATIVE Final   Bilirubin Urine 09/22/2021 NEGATIVE  NEGATIVE Final   Ketones, ur 09/22/2021 NEGATIVE  NEGATIVE mg/dL Final   Protein, ur 60/45/4098 NEGATIVE  NEGATIVE mg/dL Final   Nitrite 11/91/4782 NEGATIVE  NEGATIVE Final   Leukocytes,Ua 09/22/2021 NEGATIVE  NEGATIVE Final   RBC / HPF 09/22/2021 0-5  0 - 5 RBC/hpf Final   WBC, UA 09/22/2021 0-5  0 - 5  WBC/hpf Final   Bacteria, UA 09/22/2021 NONE SEEN  NONE SEEN Final   Squamous Epithelial / LPF 09/22/2021 0-5  0 - 5 Final   Performed at [facility], [address] [place], [place] [address]   ABO/RH(D) 09/22/2021 O POS   Final   Antibody Screen 09/22/2021 NEG   Final   Sample Expiration 09/22/2021 10/06/2021,2359   Final   Extend sample reason 09/22/2021    Final                   Value:NO TRANSFUSIONS OR PREGNANCY IN THE PAST 3 MONTHS Performed at [facility], [address] [place], [place] [address]     ECG: Date: 09/19/2021 Time ECG obtained: 0913 AM Rate: 75 bpm Rhythm: normal sinus Axis (leads I and aVF): Normal Intervals: PR 164 ms. QRS 80 ms. QTc 406 ms. ST segment and T wave changes: No evidence of acute ST segment elevation or depression. Poor R wave progression.  Comparison: Similar to previous tracing obtained on 09/19/2020   IMAGING / PROCEDURES: MYOCARDIAL PERFUSION IMAGING STUDY (LEXISCAN) performed on 06/23/2019 LVEF 68% Normal myocardial thickening and wall motion No artifacts noted Left ventricular cavity size normal No evidence of stress-induced myocardial ischemia or arrhythmia Normal low risk study  TRANSTHORACIC ECHOCARDIOGRAM performed on 06/19/2019 LVEF >55% Normal left ventricular systolic function Normal right ventricular systolic function Trivial AR, TR, and PR Mild MR No evidence of valvular stenosis No pericardial effusion  LEFT HEART CATHETERIZATION AND CORONARY ANGIOGRAPHY performed on 03/12/2017 Left-ventricular systolic function normal LVEDP normal No mitral valve stenosis or prolapse evident CAD  99% stenosis mid RCA 99% stenosis RPAV Successful PCI 2.50 x 18 mm Xience Sierra DES x 1 to the mid RCA Distal lesion treated medically   Impression and Plan:  Nichole Sanchez has been referred for pre-anesthesia review and clearance prior to her undergoing the planned anesthetic and procedural courses.  Available labs, pertinent testing, and imaging results were personally reviewed by me. This patient has been appropriately cleared by cardiology with an overall MODERATE risk of significant perioperative cardiovascular complications.  Based on clinical review performed today (09/25/21), barring any significant acute changes in the patient's overall condition, it is anticipated that she will be able to proceed with the planned surgical intervention. Any acute changes in clinical condition may necessitate her procedure being postponed and/or cancelled. Patient will meet with anesthesia team (MD and/or CRNA) on the day of her procedure for preoperative evaluation/assessment. Questions regarding anesthetic course will be fielded at that time.   Pre-surgical instructions were reviewed with the patient during her PAT  appointment and questions were fielded by PAT clinical staff. Patient was advised that if any questions or concerns arise prior to her procedure then she should return a call to PAT and/or her surgeon's office to discuss.  Quentin Mulling, MSN, APRN, FNP-C, CEN [facility]  Peri-operative Services Nurse Practitioner Phone: [phone] Fax: [phone] 09/25/21 2:08 PM  NOTE: This note has been prepared using Dragon dictation software. Despite my best ability to proofread, there is always the potential that unintentional transcriptional errors may still occur from this process.

## 2021-09-27 MED ORDER — CEFAZOLIN SODIUM-DEXTROSE 2-4 GM/100ML-% IV SOLN
2.0000 g | INTRAVENOUS | Status: AC
  Administered 2021-09-28: 2 g via INTRAVENOUS

## 2021-09-27 MED ORDER — LACTATED RINGERS IV SOLN: INTRAVENOUS | Status: DC

## 2021-09-27 NOTE — H&P (Signed)
History of Present Illness: The patient is an 81 y.o. female seen in clinic today for left knee pain.  Symptoms have been present for 2 years and began with normal activities.  The pain is described as aching, constant, and stiffness worse with prolonged sitting 8/10 .  Pain is generalized. Currently pain is aggravated with normal daily activities.  She has associated back pain and hip pain in her right hip which she describes as constant worse when sitting 6/10.  The pain in her right hip radiates from her buttocks down the lateral aspect of her hip and at times does go down past her knee and sometimes into her feet and toes.  She describes the pain as a sharp shooting in her leg.  He does endorse some groin pain at times on the right as well. She has tried acetaminophen, non-steroidal anti-inflammatories (meloxicam), and methocarbamol  with  diminishing relief .  She has tried physical therapy , rest , ice , and cortisone injections, last injection was about 9 months ago and provided no relief .  Pain is relieved by ice and rest.     She has a history of a left knee arthroscopy in the past and was told there was significant arthritis found at that time.  Of note she has an allergic reaction to propylene glycol and reacted to Dermabond skin glue from her spine surgery.  Past Medical History:     Past Medical History:  Diagnosis Date   Asthma without status asthmaticus     Degenerative arthritis of knee, bilateral     Dermatitis      severe. Allergic to propylene glyco   Elevated blood pressure     GERD (gastroesophageal reflux disease)     Hemorrhoids      with banding, 2011   History of adenomatous polyp of colon 06/18/2016   Hyperlipidemia     Hypertension     Hypothyroid     Migraines     Multiple drug allergies        Past Surgical History:      Past Surgical History:  Procedure Laterality Date   TONSILLECTOMY   1963   COLONOSCOPY   04/01/2002    Int Hemorrhoids, Diverticulosis    CHOLECYSTECTOMY   2011   COLONOSCOPY   02/23/2009    Adenomatous Polyps: CBF 02/2014; Recall Ltr mailed 12/14/2013 (dw)   KNEE ARTHROSCOPY Left 2014   COLONOSCOPY   09/19/2016    Adenomatous Polyps: CBf 09/2021   EGD   07/28/2019    Gastritis/No Repeat/CTL   L4-5 DECOMPRESSION   09/28/2020    Dr. Meade Maw at [facility]    Oral cyst removed in the 1990s by Dr. Rosana Hoes in [place], DIAGNOSTIC / THERAPEUTIC       EGD   02/23/2009, 04/01/2002, 11/24/1999   Hemorrhoids banding surgery       Joint and tendon transplant on right wrist in late [place] Right      knee   miscarriages       Vaginal hysterectomy in 1986, ovaries intact          Past Family History: Family History       Family History  Problem Relation Age of Onset   Myocardial Infarction (Heart attack) Mother     Rheum arthritis Mother     Osteoporosis (Thinning of bones) Mother  COPD Mother     Prostate cancer Brother     Lung cancer Brother         Medications:       Current Outpatient Medications Ordered in Epic  Medication Sig Dispense Refill   pantoprazole (PROTONIX) 20 MG DR tablet Take 1 tablet (20 mg total) by mouth once daily for 180 days 90 tablet 1   acetaminophen (TYLENOL) 500 MG tablet Take by mouth as needed for Pain       amLODIPine (NORVASC) 2.5 MG tablet Take 1 tablet (2.5 mg total) by mouth once daily for 180 days 90 tablet 1   aspirin 81 MG EC tablet Take 81 mg by mouth once daily       biotin 1 mg Cap Take by mouth.       candesartan (ATACAND) 32 MG tablet Take 1 tablet (32 mg total) by mouth once daily 90 tablet 3   cholecalciferol (VITAMIN D3) 2,000 unit tablet Take 2,000 Units by mouth once daily.       cyanocobalamin (VITAMIN B12) 1000 MCG tablet Take 1,000 mcg by mouth once daily.       levothyroxine (SYNTHROID) 88 MCG tablet Take 1 tablet (88 mcg total) by mouth once daily for 180 days Take on an empty stomach with  a glass of water at least 30-60 minutes before breakfast. 90 tablet 3   meclizine (ANTIVERT) 25 mg tablet Take 1 tablet (25 mg total) by mouth 3 (three) times daily as needed for Dizziness 90 tablet 1   meloxicam (MOBIC) 15 MG tablet Take 15 mg by mouth once daily       rosuvastatin (CRESTOR) 5 MG tablet Take 1 tablet (5 mg total) by mouth once daily for 180 days 90 tablet 3    No current Epic-ordered facility-administered medications on file.      Allergies:      Allergies  Allergen Reactions   Dextromethorphan Hbr Rash   Propylene Glycol Hives, Other (See Comments) and Rash      blisters   Tricor [Fenofibrate Micronized] Shortness Of Breath      Lethargic, BP increased    Nickel Rash and Other (See Comments)      Itching, blisters, raw rash Itching, blister and raw rash   Adhesive Other (See Comments)      Dermabond - blisters   Advair Diskus [Fluticasone Propion-Salmeterol] Unknown   Bacitracin-Polymyxin B Unknown   Codeine Unknown   Codeine Sulfate Rash   Cyclobenzaprine Unknown   Hydrochlorothiazide Rash   Latex Rash   Lodine [Etodolac] Unknown   Naproxen Unknown   Neomycin-Bacitracnzn-Polymyxnb Unknown   Neosporin [Benzalkonium Chloride] Unknown   Nexium [Esomeprazole Magnesium] Rash   Nystatin Rash   Percocet [Oxycodone-Acetaminophen] Rash   Prednisone Unknown   Proair Hfa [Albuterol Sulfate] Swelling   Tussionex Pennkinetic Er [Hydrocodone-Chlorpheniramine] Rash   Tylenol [Acetaminophen] Swelling      Review of Systems:  A comprehensive 14 point ROS was performed, reviewed, and the pertinent orthopaedic findings are documented in the HPI.   Physical Exam: General/Constitutional: No apparent distress: well-nourished and well developed. Eyes: Pupils equal, round with synchronous movement. Respiratory: "Non-labored breathing. Cardiac:  Heart rate is regular. Integumentary: No impressive skin lesions present, except as noted in detailed exam. Neuro/Psych: Normal  mood and affect, oriented to person, place and time.   Comprehensive Knee Exam: Gait Antalgic and shuffling  Alignment Neutral    Inspection   Right Left  Skin Normal appearance with no obvious deformity.  No ecchymosis or erythema.  Healed midline incision Normal appearance with no obvious deformity.  No ecchymosis or erythema.  Soft Tissue No focal soft tissue swelling No focal soft tissue swelling  Quad Atrophy None None    Palpation    Right Left  Tenderness No peripatellar, patellar tendon, quad tendon, medial/lateral joint line pain  +Medial joint line tenderness  Crepitus No patellofemoral or tibiofemoral crepitus +patellofemoral or tibiofemoral crepitus  Effusion None None    Range of Motion   Right Left  Flexion  0-120 0-100  Extension  Full knee extension without hyperextension Full knee extension without hyperextension    Ligamentous Exam   Right Left  Lachman Normal Normal  Valgus 0 Normal Normal  Valgus 30 Normal Normal  Varus 0 Normal Normal  Varus 30 Normal Normal  Anterior Drawer Normal Normal  Posterior Drawer Normal Normal    Meniscal Exam   Right Left  Hyperflexion Test Negative Positive  Hyperextension Test Negative Positive  McMurray's Negative Positive                Neurovascular   Right Left  Quadriceps Strength 5/5 5/5  Hamstring Strength 5/5 5/5  Hip Abductor Strength 4/5 4/5  Distal Motor Normal Normal  Distal Sensory Normal light touch sensation Normal light touch sensation  Distal Pulses Normal Normal    Right hip exam   SKIN: intact SWELLING: none WARMTH: no warmth TENDERNESS: none, Stinchfield Negative ROM: 15 degrees internal rotation and 40 degrees external rotation and pain in her buttocks with external rotation and flexion and mild discomfort with flexion and internal rotation STRENGTH: normal STABILITY: stable to testing CREPITUS: no LEG LENGTH DISCREPANCY: none NEUROLOGICAL EXAM: normal VASCULAR EXAM: normal LUMBAR  SPINE:        tenderness: no                                     straight leg raising sign: no                                     motor exam: normal   The contralateral hip was examined for comparison and it showed: TENDERNESS: none ROM: normal and full STRENGTH: normal STABILITY: stable to testing     Pulmonary exam: Lungs clear to auscultation bilaterally no wheezing rales or rhonchi Cardiac exam: Regular rate and rhythm no obvious murmurs rubs or gallops.   Imaging Studies: I have reviewed AP, lateral, x-rays left knee taken 2 days ago by her primary care visit which show significant arthritic changes in the left knee in the medial compartment and patellofemoral compartment with bone-on-bone articulation in both compartments sclerosis , osteophyte formation , subchondral cyst formation Kellgren-Lawrence grade 4.   I reviewed flexed PA weight bearing and sunrise knee X-rays (2 views) of the left knee taken today which reveal severe degenerative arthritis with joint space narrowing in the patellofemoral and medial compartment and osteophyte formation, subchondral cysts and sclerosis.  Consistent with previous x-rays.    I have reviewed AP and frog-leg lateral pelvis and right hip x-rays taken 2 days ago by her primary care physician which show mild arthritic changes in the right hip with some joint space narrowing lateral edge osteophyte formation on the acetabulum sclerosis and some subchondral cyst tonnis grade 1   Assessment:  ICD-10-CM  1. 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 Based upon the patient's continued symptoms and failure to respond to conservative treatment, I have recommended a left total knee replacement for this patient for her left knee.  Additionally she has some mild  right hip arthritic changes on imaging and symptomology and exam findings consistent with a possible right lower extremity radiculopathy and sciatica.  Given the nature of the pain in her left knee and her right hip I will elect to proceed with a left total knee replacement and will have her right hip and radicular symptoms evaluated by neurosurgery to help parse out the source of her pain.  She does have a history of lumbar surgery for nerve compression in year ago with Dr. Cari Caraway.    A long discussion took place with the patient describing what a total joint replacement is and what the procedure would entail. A knee model, similar to the implants that will be used during the operation, was utilized to demonstrate the implants. Choices of implant manufactures were discussed and reviewed. The ability to secure the implant utilizing cement or cementless (press fit) fixation was discussed. The approach and exposure was discussed.    The hospitalization and post-operative care and rehabilitation were also discussed. The use of perioperative antibiotics and DVT prophylaxis were discussed. The risk, benefits and alternatives to a surgical intervention were discussed at length with the patient. The patient was also advised of risks related to the medical comorbidities and elevated body mass index (BMI). A lengthy discussion took place to review the most common complications including but not limited to: deep vein thrombosis, pulmonary embolus, heart attack, stroke, infection, wound breakdown, numbness, damage to nerves, tendon,muscles, arteries or other blood vessels, death and other possible complications from anesthesia. The patient was told that we will take steps to minimize these risks by using sterile technique, antibiotics and DVT prophylaxis when appropriate and follow the patient postoperatively in the office setting to monitor progress. The possibility of recurrent pain, no improvement in pain and actual  worsening of pain were also discussed with the patient.    The discharge plan of care focused on the patient going home following surgery. The patient was encouraged to make the necessary arrangements to have someone stay with them when they are discharged home.    The benefits of surgery were discussed with the patient including the potential for improving the patient's current clinical condition through operative intervention. Alternatives to surgical intervention including continued conservative management were also discussed in detail. All questions were answered to the satisfaction of the patient. The patient participated and agreed to the plan of care as well as the use of the recommended implants for their total knee replacement surgery.        Patient will need medical and cardiac clearance for the surgery.   The patient had a reaction to Dermabond in the past and we will not use any skin glue on closure.      Steffanie Rainwater, MD [facility] [address], [place] [address] Phone: [phone]  History and physical exam reviewed with patient on 09/28/2021 in preop no change in history or examination. Patient for left total knee replacement.

## 2021-09-28 ENCOUNTER — Other Ambulatory Visit: Payer: Self-pay

## 2021-09-28 ENCOUNTER — Ambulatory Visit: Payer: Medicare Other

## 2021-09-28 ENCOUNTER — Encounter: Payer: Self-pay | Admitting: *Deleted

## 2021-09-28 ENCOUNTER — Ambulatory Visit: Payer: Medicare Other | Admitting: Urgent Care

## 2021-09-28 ENCOUNTER — Encounter
Admission: RE | Disposition: A | Discharge status: Home / Self Care | Payer: Self-pay | Source: Home / Self Care | Attending: Orthopedic Surgery

## 2021-09-28 ENCOUNTER — Observation Stay
Admission: RE | Admit: 2021-09-28 | Discharge: 2021-09-29 | Disposition: A | Discharge status: Home / Self Care | Payer: Medicare Other | Attending: Orthopedic Surgery | Admitting: Orthopedic Surgery

## 2021-09-28 DIAGNOSIS — Z7982 Long term (current) use of aspirin: Secondary | ICD-10-CM | POA: Insufficient documentation

## 2021-09-28 DIAGNOSIS — Z79899 Other long term (current) drug therapy: Secondary | ICD-10-CM | POA: Insufficient documentation

## 2021-09-28 DIAGNOSIS — I10 Essential (primary) hypertension: Secondary | ICD-10-CM | POA: Insufficient documentation

## 2021-09-28 DIAGNOSIS — Z96651 Presence of right artificial knee joint: Secondary | ICD-10-CM | POA: Insufficient documentation

## 2021-09-28 DIAGNOSIS — Z01818 Encounter for other preprocedural examination: Secondary | ICD-10-CM

## 2021-09-28 DIAGNOSIS — E039 Hypothyroidism, unspecified: Secondary | ICD-10-CM | POA: Insufficient documentation

## 2021-09-28 DIAGNOSIS — J4520 Mild intermittent asthma, uncomplicated: Secondary | ICD-10-CM | POA: Insufficient documentation

## 2021-09-28 DIAGNOSIS — M1712 Unilateral primary osteoarthritis, left knee: Principal | ICD-10-CM | POA: Diagnosis present

## 2021-09-28 DIAGNOSIS — I2510 Atherosclerotic heart disease of native coronary artery without angina pectoris: Secondary | ICD-10-CM | POA: Insufficient documentation

## 2021-09-28 DIAGNOSIS — Z85828 Personal history of other malignant neoplasm of skin: Secondary | ICD-10-CM | POA: Diagnosis not present

## 2021-09-28 DIAGNOSIS — Z91040 Latex allergy status: Secondary | ICD-10-CM | POA: Diagnosis not present

## 2021-09-28 HISTORY — DX: Benign neoplasm, unspecified site: D36.9

## 2021-09-28 HISTORY — DX: Presence of dental prosthetic device (complete) (partial): K08.109

## 2021-09-28 HISTORY — PX: TOTAL KNEE ARTHROPLASTY: SHX125

## 2021-09-28 HISTORY — DX: Basal cell carcinoma of skin of unspecified parts of face: C44.310

## 2021-09-28 HISTORY — DX: Mild intermittent asthma, uncomplicated: J45.20

## 2021-09-28 HISTORY — DX: Atherosclerosis of aorta: I70.0

## 2021-09-28 LAB — POCT I-STAT, CHEM 8
BUN: 16 mg/dL (ref 8–23)
Calcium, Ion: 1.24 mmol/L (ref 1.15–1.40)
Chloride: 103 mmol/L (ref 98–111)
Creatinine, Ser: 0.8 mg/dL (ref 0.44–1.00)
Glucose, Bld: 85 mg/dL (ref 70–99)
HCT: 40 % (ref 36.0–46.0)
Hemoglobin: 13.6 g/dL (ref 12.0–15.0)
Potassium: 4.3 mmol/L (ref 3.5–5.1)
Sodium: 138 mmol/L (ref 135–145)
TCO2: 25 mmol/L (ref 22–32)

## 2021-09-28 SURGERY — ARTHROPLASTY, KNEE, TOTAL
Anesthesia: General | Site: Knee | Laterality: Left

## 2021-09-28 MED ORDER — MIDAZOLAM HCL 2 MG/2ML IJ SOLN
INTRAMUSCULAR | Status: AC
  Filled 2021-09-28: qty 2

## 2021-09-28 MED ORDER — LIDOCAINE HCL (PF) 2 % IJ SOLN
INTRAMUSCULAR | Status: AC
  Filled 2021-09-28: qty 5

## 2021-09-28 MED ORDER — PHENYLEPHRINE 80 MCG/ML (10ML) SYRINGE FOR IV PUSH (FOR BLOOD PRESSURE SUPPORT)
PREFILLED_SYRINGE | INTRAVENOUS | Status: AC
  Filled 2021-09-28: qty 10

## 2021-09-28 MED ORDER — PHENYLEPHRINE HCL (PRESSORS) 10 MG/ML IV SOLN
INTRAVENOUS | Status: DC | PRN
  Administered 2021-09-28 (×7): 80 ug via INTRAVENOUS

## 2021-09-28 MED ORDER — TRANEXAMIC ACID 1000 MG/10ML IV SOLN
INTRAVENOUS | Status: AC
  Filled 2021-09-28: qty 10

## 2021-09-28 MED ORDER — ACETAMINOPHEN 10 MG/ML IV SOLN
INTRAVENOUS | Status: DC | PRN
  Administered 2021-09-28: 1000 mg via INTRAVENOUS

## 2021-09-28 MED ORDER — PROPOFOL 500 MG/50ML IV EMUL
INTRAVENOUS | Status: DC | PRN
  Administered 2021-09-28: 80 ug/kg/min via INTRAVENOUS

## 2021-09-28 MED ORDER — ROSUVASTATIN CALCIUM 5 MG PO TABS
5.0000 mg | ORAL_TABLET | Freq: Every day | ORAL | Status: DC
  Administered 2021-09-29: 5 mg via ORAL
  Filled 2021-09-28: qty 1

## 2021-09-28 MED ORDER — BUPIVACAINE-EPINEPHRINE (PF) 0.25% -1:200000 IJ SOLN
INTRAMUSCULAR | Status: AC
  Filled 2021-09-28: qty 30

## 2021-09-28 MED ORDER — ONDANSETRON HCL 4 MG/2ML IJ SOLN: 4.0000 mg | Freq: Once | INTRAMUSCULAR | Status: DC | PRN

## 2021-09-28 MED ORDER — SODIUM CHLORIDE FLUSH 0.9 % IV SOLN
INTRAVENOUS | Status: AC
  Filled 2021-09-28: qty 40

## 2021-09-28 MED ORDER — CEFAZOLIN SODIUM-DEXTROSE 2-4 GM/100ML-% IV SOLN
INTRAVENOUS | Status: AC
  Filled 2021-09-28: qty 100

## 2021-09-28 MED ORDER — SODIUM CHLORIDE 0.9 % IR SOLN
Status: DC | PRN
  Administered 2021-09-28: 3000 mL

## 2021-09-28 MED ORDER — PROPOFOL 10 MG/ML IV BOLUS
INTRAVENOUS | Status: AC
  Filled 2021-09-28: qty 20

## 2021-09-28 MED ORDER — ONDANSETRON HCL 4 MG/2ML IJ SOLN: 4.0000 mg | Freq: Four times a day (QID) | INTRAMUSCULAR | Status: DC | PRN

## 2021-09-28 MED ORDER — SODIUM CHLORIDE (PF) 0.9 % IJ SOLN
INTRAMUSCULAR | Status: DC | PRN
  Administered 2021-09-28: 90 mL via INTRAMUSCULAR

## 2021-09-28 MED ORDER — ONDANSETRON HCL 4 MG PO TABS: 4.0000 mg | ORAL_TABLET | Freq: Four times a day (QID) | ORAL | Status: DC | PRN

## 2021-09-28 MED ORDER — SURGIPHOR WOUND IRRIGATION SYSTEM - OPTIME: TOPICAL | Status: DC | PRN

## 2021-09-28 MED ORDER — LACTATED RINGERS IV SOLN: INTRAVENOUS | Status: DC | PRN

## 2021-09-28 MED ORDER — METOCLOPRAMIDE HCL 5 MG/ML IJ SOLN: 5.0000 mg | Freq: Three times a day (TID) | INTRAMUSCULAR | Status: DC | PRN

## 2021-09-28 MED ORDER — MENTHOL 3 MG MT LOZG: 1.0000 | LOZENGE | OROMUCOSAL | Status: DC | PRN

## 2021-09-28 MED ORDER — ONDANSETRON HCL 4 MG/2ML IJ SOLN
INTRAMUSCULAR | Status: AC
  Filled 2021-09-28: qty 2

## 2021-09-28 MED ORDER — DEXAMETHASONE SODIUM PHOSPHATE 10 MG/ML IJ SOLN
INTRAMUSCULAR | Status: DC | PRN
  Administered 2021-09-28: 10 mg via INTRAVENOUS

## 2021-09-28 MED ORDER — DOCUSATE SODIUM 100 MG PO CAPS
100.0000 mg | ORAL_CAPSULE | Freq: Two times a day (BID) | ORAL | Status: DC
  Administered 2021-09-28: 100 mg via ORAL
  Filled 2021-09-28 (×2): qty 1

## 2021-09-28 MED ORDER — CEFAZOLIN SODIUM-DEXTROSE 2-4 GM/100ML-% IV SOLN
2.0000 g | Freq: Four times a day (QID) | INTRAVENOUS | Status: AC
  Administered 2021-09-28: 2 g via INTRAVENOUS
  Filled 2021-09-28: qty 100

## 2021-09-28 MED ORDER — BUPIVACAINE HCL (PF) 0.5 % IJ SOLN
INTRAMUSCULAR | Status: DC | PRN
  Administered 2021-09-28: 2.5 mL via INTRATHECAL

## 2021-09-28 MED ORDER — FENTANYL CITRATE (PF) 100 MCG/2ML IJ SOLN
INTRAMUSCULAR | Status: DC | PRN
  Administered 2021-09-28 (×3): 25 ug via INTRAVENOUS

## 2021-09-28 MED ORDER — ASPIRIN 81 MG PO TBEC
81.0000 mg | DELAYED_RELEASE_TABLET | Freq: Every day | ORAL | Status: DC
  Administered 2021-09-29: 81 mg via ORAL
  Filled 2021-09-28: qty 1

## 2021-09-28 MED ORDER — PHENOL 1.4 % MT LIQD: 1.0000 | OROMUCOSAL | Status: DC | PRN

## 2021-09-28 MED ORDER — FENTANYL CITRATE (PF) 100 MCG/2ML IJ SOLN
INTRAMUSCULAR | Status: AC
  Filled 2021-09-28: qty 2

## 2021-09-28 MED ORDER — BUPIVACAINE LIPOSOME 1.3 % IJ SUSP
INTRAMUSCULAR | Status: AC
  Filled 2021-09-28: qty 20

## 2021-09-28 MED ORDER — ENOXAPARIN SODIUM 30 MG/0.3ML IJ SOSY
30.0000 mg | PREFILLED_SYRINGE | Freq: Two times a day (BID) | INTRAMUSCULAR | Status: DC
  Administered 2021-09-29: 30 mg via SUBCUTANEOUS
  Filled 2021-09-28: qty 0.3

## 2021-09-28 MED ORDER — AMLODIPINE BESYLATE 5 MG PO TABS
2.5000 mg | ORAL_TABLET | Freq: Every day | ORAL | Status: DC
  Administered 2021-09-28: 2.5 mg via ORAL
  Filled 2021-09-28: qty 1

## 2021-09-28 MED ORDER — ACETAMINOPHEN 10 MG/ML IV SOLN
INTRAVENOUS | Status: AC
  Filled 2021-09-28: qty 100

## 2021-09-28 MED ORDER — LACTATED RINGERS IV SOLN: INTRAVENOUS | Status: DC

## 2021-09-28 MED ORDER — DEXAMETHASONE SODIUM PHOSPHATE 10 MG/ML IJ SOLN
INTRAMUSCULAR | Status: AC
  Filled 2021-09-28: qty 1

## 2021-09-28 MED ORDER — CHLORHEXIDINE GLUCONATE 0.12 % MT SOLN
OROMUCOSAL | Status: AC
  Filled 2021-09-28: qty 15

## 2021-09-28 MED ORDER — BUPIVACAINE HCL (PF) 0.5 % IJ SOLN
INTRAMUSCULAR | Status: AC
  Filled 2021-09-28: qty 10

## 2021-09-28 MED ORDER — ACETAMINOPHEN 500 MG PO TABS
1000.0000 mg | ORAL_TABLET | Freq: Three times a day (TID) | ORAL | Status: DC
  Administered 2021-09-28 – 2021-09-29 (×2): 1000 mg via ORAL
  Filled 2021-09-28 (×2): qty 2

## 2021-09-28 MED ORDER — TRANEXAMIC ACID-NACL 1000-0.7 MG/100ML-% IV SOLN
INTRAVENOUS | Status: DC | PRN
  Administered 2021-09-28 (×2): 1000 mg via INTRAVENOUS

## 2021-09-28 MED ORDER — MECLIZINE HCL 25 MG PO TABS: 25.0000 mg | ORAL_TABLET | Freq: Three times a day (TID) | ORAL | Status: DC | PRN

## 2021-09-28 MED ORDER — PROPOFOL 10 MG/ML IV BOLUS
INTRAVENOUS | Status: DC | PRN
  Administered 2021-09-28 (×2): 30 mg via INTRAVENOUS
  Administered 2021-09-28 (×2): 20 mg via INTRAVENOUS

## 2021-09-28 MED ORDER — FAMOTIDINE 20 MG PO TABS
ORAL_TABLET | ORAL | Status: AC
  Administered 2021-09-28: 20 mg via ORAL
  Filled 2021-09-28: qty 1

## 2021-09-28 MED ORDER — FENTANYL CITRATE (PF) 100 MCG/2ML IJ SOLN: 25.0000 ug | INTRAMUSCULAR | Status: DC | PRN

## 2021-09-28 MED ORDER — 0.9 % SODIUM CHLORIDE (POUR BTL) OPTIME
TOPICAL | Status: DC | PRN
  Administered 2021-09-28: 500 mL

## 2021-09-28 MED ORDER — ONDANSETRON HCL 4 MG/2ML IJ SOLN
INTRAMUSCULAR | Status: DC | PRN
  Administered 2021-09-28: 4 mg via INTRAVENOUS

## 2021-09-28 MED ORDER — PHENYLEPHRINE 80 MCG/ML (10ML) SYRINGE FOR IV PUSH (FOR BLOOD PRESSURE SUPPORT): PREFILLED_SYRINGE | INTRAVENOUS | Status: DC | PRN

## 2021-09-28 MED ORDER — ACETAMINOPHEN 10 MG/ML IV SOLN: 1000.0000 mg | Freq: Once | INTRAVENOUS | Status: DC | PRN

## 2021-09-28 MED ORDER — ORAL CARE MOUTH RINSE: 15.0000 mL | Freq: Once | OROMUCOSAL | Status: DC

## 2021-09-28 MED ORDER — PROPOFOL 1000 MG/100ML IV EMUL
INTRAVENOUS | Status: AC
  Filled 2021-09-28: qty 100

## 2021-09-28 MED ORDER — TRAMADOL HCL 50 MG PO TABS
50.0000 mg | ORAL_TABLET | ORAL | Status: DC | PRN
  Administered 2021-09-28 – 2021-09-29 (×4): 50 mg via ORAL
  Filled 2021-09-28 (×4): qty 1

## 2021-09-28 MED ORDER — TRAMADOL HCL 50 MG PO TABS
50.0000 mg | ORAL_TABLET | Freq: Four times a day (QID) | ORAL | Status: DC | PRN
  Administered 2021-09-28: 50 mg via ORAL
  Filled 2021-09-28: qty 1

## 2021-09-28 MED ORDER — LEVOTHYROXINE SODIUM 88 MCG PO TABS
88.0000 ug | ORAL_TABLET | Freq: Every day | ORAL | Status: DC
  Administered 2021-09-29: 88 ug via ORAL
  Filled 2021-09-28: qty 1

## 2021-09-28 MED ORDER — FAMOTIDINE 20 MG PO TABS: 20.0000 mg | ORAL_TABLET | Freq: Once | ORAL | Status: AC

## 2021-09-28 MED ORDER — METOCLOPRAMIDE HCL 5 MG PO TABS: 5.0000 mg | ORAL_TABLET | Freq: Three times a day (TID) | ORAL | Status: DC | PRN

## 2021-09-28 MED ORDER — CELECOXIB 200 MG PO CAPS
200.0000 mg | ORAL_CAPSULE | Freq: Two times a day (BID) | ORAL | Status: DC
  Administered 2021-09-28 – 2021-09-29 (×2): 200 mg via ORAL
  Filled 2021-09-28 (×2): qty 1

## 2021-09-28 MED ORDER — IRBESARTAN 150 MG PO TABS
300.0000 mg | ORAL_TABLET | Freq: Every day | ORAL | Status: DC
  Administered 2021-09-29: 300 mg via ORAL
  Filled 2021-09-28: qty 2

## 2021-09-28 MED ORDER — LORATADINE 10 MG PO TABS
10.0000 mg | ORAL_TABLET | Freq: Every day | ORAL | Status: DC
  Administered 2021-09-29: 10 mg via ORAL
  Filled 2021-09-28: qty 1

## 2021-09-28 SURGICAL SUPPLY — 84 items
ADH SKN CLS APL DERMABOND .7 (GAUZE/BANDAGES/DRESSINGS)
APL PRP STRL LF DISP 70% ISPRP (MISCELLANEOUS) ×2
BLADE PATELLA REAM PILOT HOLE (MISCELLANEOUS) IMPLANT
BLADE SAGITTAL WIDE XTHICK NO (BLADE) ×1 IMPLANT
BLADE SAW 70X12.5 (BLADE) IMPLANT
BLADE SAW SAG 25X90X1.19 (BLADE) IMPLANT
BLADE SAW SAG 29X58X.64 (BLADE) ×1 IMPLANT
BNDG ELASTIC 6X5.8 VLCR STR LF (GAUZE/BANDAGES/DRESSINGS) ×1 IMPLANT
BOWL CEMENT MIX W/ADAPTER (MISCELLANEOUS) ×1 IMPLANT
BSPLAT TIB 5D D CMNT STM LT (Knees) ×1 IMPLANT
CEMENT BONE R 1X40 (Cement) IMPLANT
CHLORAPREP W/TINT 26 (MISCELLANEOUS) ×1 IMPLANT
COMP PSN FEM CR CMT STD SZ 6L (Miscellaneous) ×1 IMPLANT
COMP PSN MC ASF L 13 6-7/CD (Miscellaneous) ×1 IMPLANT
COMPONENT PSN ASF L 13 6-7/CD (Miscellaneous) IMPLANT
COMPONENT PSN FEM CMT STD SZ6L (Miscellaneous) IMPLANT
COOLER POLAR GLACIER W/PUMP (MISCELLANEOUS) ×1 IMPLANT
CUFF TOURN SGL QUICK 24 (TOURNIQUET CUFF)
CUFF TOURN SGL QUICK 34 (TOURNIQUET CUFF)
CUFF TRNQT CYL 24X4X16.5-23 (TOURNIQUET CUFF) IMPLANT
CUFF TRNQT CYL 34X4.125X (TOURNIQUET CUFF) IMPLANT
DERMABOND ADVANCED .7 DNX12 (GAUZE/BANDAGES/DRESSINGS) ×1 IMPLANT
DRAPE 3/4 80X56 (DRAPES) ×1 IMPLANT
DRAPE INCISE IOBAN 66X60 STRL (DRAPES) ×1 IMPLANT
DRAPE U-SHAPE 47X51 STRL (DRAPES) ×1 IMPLANT
DRSG MEPILEX SACRM 8.7X9.8 (GAUZE/BANDAGES/DRESSINGS) ×1 IMPLANT
DRSG OPSITE POSTOP 4X10 (GAUZE/BANDAGES/DRESSINGS) ×1 IMPLANT
DRSG OPSITE POSTOP 4X8 (GAUZE/BANDAGES/DRESSINGS) ×1 IMPLANT
ELECT CAUTERY BLADE 6.4 (BLADE) ×1 IMPLANT
ELECT REM PT RETURN 9FT ADLT (ELECTROSURGICAL) ×1
ELECTRODE REM PT RTRN 9FT ADLT (ELECTROSURGICAL) ×1 IMPLANT
GLOVE SURG SYN 7.5  E (GLOVE) ×1
GLOVE SURG SYN 7.5 E (GLOVE) ×1 IMPLANT
GLOVE SURG SYN 7.5 PF PI (GLOVE) ×1 IMPLANT
GLOVE SURG SYN 8.0 (GLOVE) ×1 IMPLANT
GLOVE SURG SYN 8.0 PF PI (GLOVE) ×1 IMPLANT
GOWN STRL REUS W/ TWL LRG LVL3 (GOWN DISPOSABLE) ×1 IMPLANT
GOWN STRL REUS W/ TWL XL LVL3 (GOWN DISPOSABLE) ×1 IMPLANT
GOWN STRL REUS W/TWL LRG LVL3 (GOWN DISPOSABLE) ×1
GOWN STRL REUS W/TWL XL LVL3 (GOWN DISPOSABLE) ×1
HDLS TROCR DRIL PIN KNEE 75 (PIN) ×4
HOOD PEEL AWAY FLYTE STAYCOOL (MISCELLANEOUS) ×2 IMPLANT
IV NS IRRIG 3000ML ARTHROMATIC (IV SOLUTION) ×1 IMPLANT
KIT TURNOVER KIT A (KITS) ×1 IMPLANT
MANIFOLD NEPTUNE II (INSTRUMENTS) ×1 IMPLANT
MARKER SKIN DUAL TIP RULER LAB (MISCELLANEOUS) IMPLANT
MAT ABSORB  FLUID 56X50 GRAY (MISCELLANEOUS) ×1
MAT ABSORB FLUID 56X50 GRAY (MISCELLANEOUS) ×1 IMPLANT
NDL HYPO 21X1.5 SAFETY (NEEDLE) ×1 IMPLANT
NEEDLE HYPO 21X1.5 SAFETY (NEEDLE) ×1 IMPLANT
NS IRRIG 1000ML POUR BTL (IV SOLUTION) ×1 IMPLANT
NS IRRIG 500ML POUR BTL (IV SOLUTION) IMPLANT
PACK TOTAL KNEE (MISCELLANEOUS) ×1 IMPLANT
PAD WRAPON POLAR KNEE (MISCELLANEOUS) ×1 IMPLANT
PIN DRILL HDLS TROCAR 75 4PK (PIN) IMPLANT
PULSAVAC PLUS IRRIG FAN TIP (DISPOSABLE) ×1
SCREW FEMALE HEX FIX 25X2.5 (ORTHOPEDIC DISPOSABLE SUPPLIES) IMPLANT
SCREW HEADED 33MM KNEE (MISCELLANEOUS) IMPLANT
SOLUTION IRRIG SURGIPHOR (IV SOLUTION) ×1 IMPLANT
STEM POLY PAT PLY 32M KNEE (Knees) IMPLANT
STEM TIB ST PERS 14+30 (Stem) IMPLANT
STEM TIBIA 5 DEG SZ D L KNEE (Knees) IMPLANT
STRIP CLOSURE SKIN 1/2X4 (GAUZE/BANDAGES/DRESSINGS) IMPLANT
SUCTION FRAZIER HANDLE 10FR (MISCELLANEOUS) ×1
SUCTION TUBE FRAZIER 10FR DISP (MISCELLANEOUS) ×1 IMPLANT
SUT DVC 2 QUILL PDO  T11 36X36 (SUTURE) ×1
SUT DVC 2 QUILL PDO T11 36X36 (SUTURE) ×1 IMPLANT
SUT DVC VLOC 3-0 CL 6 P-12 (SUTURE) ×1 IMPLANT
SUT DVC VLOC 90 3-0 CV23 UNDY (SUTURE) IMPLANT
SUT V-LOC 90 ABS DVC 3-0 CL (SUTURE) IMPLANT
SUT VIC AB 0 CT1 36 (SUTURE) ×1 IMPLANT
SUT VIC AB 1 CT1 36 (SUTURE) IMPLANT
SUT VIC AB 2-0 CT2 27 (SUTURE) ×1 IMPLANT
SUT VICRYL 1-0 27IN ABS (SUTURE) ×1
SUTURE VICRYL 1-0 27IN ABS (SUTURE) ×1 IMPLANT
SYR 20ML LL LF (SYRINGE) ×1 IMPLANT
SYR CONTROL 10ML LL (SYRINGE) ×2 IMPLANT
TIBIA STEM 5 DEG SZ D L KNEE (Knees) ×1 IMPLANT
TIP FAN IRRIG PULSAVAC PLUS (DISPOSABLE) ×1 IMPLANT
TOWEL OR 17X26 4PK STRL BLUE (TOWEL DISPOSABLE) ×1 IMPLANT
TRAP FLUID SMOKE EVACUATOR (MISCELLANEOUS) ×1 IMPLANT
TUBE KAMVAC SUCTION (TUBING) IMPLANT
WATER STERILE IRR 1000ML POUR (IV SOLUTION) ×1 IMPLANT
WRAPON POLAR PAD KNEE (MISCELLANEOUS) ×1

## 2021-09-28 NOTE — Anesthesia Postprocedure Evaluation (Signed)
Anesthesia Post Note  Patient: Nichole Sanchez  Procedure(s) Performed: TOTAL KNEE ARTHROPLASTY (Left: Knee)  Patient location during evaluation: PACU Anesthesia Type: Spinal Level of consciousness: awake and alert, oriented and patient cooperative Pain management: pain level controlled Vital Signs Assessment: post-procedure vital signs reviewed and stable Respiratory status: spontaneous breathing, nonlabored ventilation and respiratory function stable Cardiovascular status: blood pressure returned to baseline and stable Postop Assessment: adequate PO intake, no headache and spinal receding Anesthetic complications: no   No notable events documented.   Last Vitals:  Vitals:   09/28/21 1100 09/28/21 1120  BP: 139/64 116/65  Pulse: 75 73  Resp: (!) 21 18  Temp: 36.4 C (!) 36.4 C  SpO2: 96% 97%    Last Pain:  Vitals:   09/28/21 1100  TempSrc:   PainSc: 0-No pain                 Darrin Nipper

## 2021-09-28 NOTE — Transfer of Care (Signed)
Immediate Anesthesia Transfer of Care Note  Patient: Nichole Sanchez  Procedure(s) Performed: TOTAL KNEE ARTHROPLASTY (Left: Knee)  Patient Location: PACU  Anesthesia Type:Spinal  Level of Consciousness: awake  Airway & Oxygen Therapy: Patient Spontanous Breathing  Post-op Assessment: Report given to RN, Post -op Vital signs reviewed and stable and Patient moving all extremities  Post vital signs: Reviewed and stable  Last Vitals:  Vitals Value Taken Time  BP 126/68 09/28/21 1008  Temp    Pulse 80 09/28/21 1011  Resp 12 09/28/21 1011  SpO2 94 % 09/28/21 1011  Vitals shown include unvalidated device data.  Last Pain:  Vitals:   09/28/21 0650  TempSrc: Temporal  PainSc: 7       Patients Stated Pain Goal: 3 (41/66/06 3016)  Complications: No notable events documented.

## 2021-09-28 NOTE — Op Note (Signed)
Patient Name: Nichole Sanchez  PRX:[id number]  Pre-Operative Diagnosis: Left knee Osteoarthritis  Post-Operative Diagnosis: (same)  Procedure: Left Total Knee Arthroplasty  Components/Implants: Femur: size 6 CR Titanium w/ 14x16m stubby extension  Tibia: size D   Poly: 13 MC  Patella 32x8.5 cemented  Date of Surgery: 09/28/2021  Surgeon: ZSteffanie RainwaterMD  Assistant: MOlean Ree  Anesthesiologist: Mazzoni  Anesthesia: Spinal  EBL: 50  Tourniquet time 78 min  IV fluids: 9462MM Complications: None   Brief history: The patient is a 81year old female with a history of osteoarthritis of the left knee with pain limiting their range of motion and activities of daily living, which has failed multiple attempts at conservative therapy.  The risks and benefits of total knee arthroplasty as definitive surgical treatment were discussed with the patient, who opted to proceed with the operation.  After outpatient medical clearance and optimization was completed [facility]  All preoperative films were reviewed and an appropriate surgical plan was made prior to surgery. Preoperative range of motion was 0 to 100  with a 0 flexion contracture. The patient was identified as having a varus alignment.   Description of procedure: The patient was brought to the operating room where laterality was confirmed by all those present to be the left side.   Spinal anesthesia was administered and the patient received an intravenous dose of antibiotics for surgical prophylaxis and a dose of tranexamic acid.  Patient is positioned supine on the operating room table with all bony prominences well-padded.  A well-padded tourniquet was applied to the left thigh.  The knee was then prepped and draped in usual sterile fashion with multiple layers of adhesive and nonadhesive drapes.  All of those present in the operating room participated in a surgical  timeout laterality and patient were confirmed.   An Esmarch was wrapped around the extremity and the leg was elevated and the knee flexed.  The tourniquet was inflated to a pressure of  275 mmHg. The Esmarch was removed and the leg was brought down to full extension.  The patella and tibial tubercle identified and outlined using a marking pen and a midline skin incision was made with a knife carried through the subcutaneous tissue down to the extensor retinaculum.  After exposure of the extensor mechanism the medial parapatellar arthrotomy was performed with a scalpel and electrocautery extending down medial and distal to the tibial tubercle taking care to avoid incising the patellar tendon.   A standard medial release was performed over the proximal tibia.  The knee was brought into extension in order to excise the fat pad taking care not to damage the patella tendon.  The superior soft tissue was removed from the anterior surface of the distal femur to visualize for the procedure.  The knee was then brought into flexion with the patella subluxed laterally and subluxing the tibia anteriorly.  The ACL was transected and removed with electrocautery and additional soft tissue was removed from the proximal surface of the tibia to fully expose.   An extramedullary tibial cutting guide was then applied to the leg with a spring-loaded ankle clamp placed around the distal tibia just above the malleoli the angulation of the guide was adjusted to give some posterior slope in the tibial resection with a neutral varus valgus alignment.  The resection guide was then pinned to the proximal tibia and the proximal tibial surface was resected with an oscillating saw.  Careful attention was paid to ensure the blade did not disrupt any of the soft tissues including any lateral or medial ligament.  Attention was then turned to the femur, with the knee slightly flexed a opening drill was used to enter the medullary canal of the  femur.  After removing the drill marrow was suctioned out to decompress the distal femur.  An intramedullary femoral guide was then inserted into the drill hole and the alignment guide was seated firmly against the distal end of the medial femoral condyle.  The distal femoral cutting guide was then attached and pinned securely to the anterior surface of the femur and the intramedullary rod and alignment guide was removed.  Distal femur resection was then performed with an oscillating saw with retractors protecting medial and laterally.   The distal cutting block was then removed and the extension gap was checked with a spacer.  Extension gap was found to be appropriately sized to accommodate the spacer block.   The femoral sizing guide was then placed securely into the posterior condyles of the femur and the femoral size was measured and determined to be 6.  The size 6, 4-in-1 cutting guide was placed in position and secured with 2 pins.  The anterior posterior and chamfer resections were then performed with an oscillating saw.  Bony fragments and osteophytes were then removed.  Using a lamina spreader the posterior medial and lateral condyles were checked for additional osteophytes and posterior soft tissue remnants.  Any remaining meniscus was removed at this time.   The tibia was then exposed and the tibial trial was pinned onto the plateau after confirming appropriate orientation and rotation.  Using the drill bushing the tibia was prepared to the appropriate drill depth.  Tibial broach impactor was then driven through the punch guide using a mallet. The femoral trial was placed.  A trial tibial polyethylene bearing was then placed and the knee was reduced.  The knee achieved full extension with no hyperextension and was found to be balanced in flexion and extension with the trials in place.  The knee was then brought into full extension the patella was everted and held with 2 Kocher clamps.  The articular  surface of the patella was then resected with calibrated reamer and an oscillating saw after careful measurement with a caliper.  The patella was then prepared with the drill guide and a trial patella was placed.  The knee was then taken through range of motion and it was found that the patella circulated appropriately with the trochlea and good patellofemoral motion without subluxation.    The correct final components for implantation were confirmed and opened by the circulator nurse.  The prepared surfaces of the patella femur and tibia were cleaned with pulsatile lavage to remove all blood fat and other material and then the surfaces were dried.  2 bags of cement were mixed under vacuum and the components were cemented into place.  Excess cement was removed with curettes and forceps.  The final polyethylene tibial component was implanted and the knee was brought into full extension to allow the cement to set.  At this time the periarticular injection cocktail was placed in the soft tissues surrounding the knee.  The knee was then irrigated with copious amount of normal saline via pulsatile lavage to remove all loose bodies and other debris.  The knee was then irrigated with surgiphor betadine based wash and reirrigated with saline.  The tourniquet was then dropped and all  bleeding vessels were identified and coagulated.  The arthrotomy was approximated with #1 Vicryl and closed with #2 Quill suture.  The knee was brought into slight flexion and the subcutaneous tissues were closed with 0 Vicryl, 2-0 Vicryl and a running subcuticular 3-0 vlock suture.  Steri-strips were then placed due to an allergy to skin glue. A sterile adhesive dressing was then placed along with a sequential compression device to the calf, a Ted stocking, and a cryotherapy cuff.   The patient was transferred off of the operating room table to a hospital bed, good pulses were found distally on the operative side.  The patient was  transferred to the recovery room in stable condition.    Steffanie Rainwater

## 2021-09-28 NOTE — Interval H&P Note (Signed)
History and Physical Interval Note:  09/28/2021 7:21 AM  Nichole Sanchez  has presented today for surgery, with the diagnosis of Localized osteoarthritis of left knee  M17.12 Left knee pain, unspecified chronicity  M25.562.  The various methods of treatment have been discussed with the patient and family. After consideration of risks, benefits and other options for treatment, the patient has consented to  Procedure(s): TOTAL KNEE ARTHROPLASTY (Left) as a surgical intervention.  The patient's history has been reviewed, patient examined, no change in status, stable for surgery.  I have reviewed the patient's chart and labs.  Questions were answered to the patient's satisfaction.     Steffanie Rainwater

## 2021-09-28 NOTE — Progress Notes (Signed)
Pt bladder scan: 388 mL. Dr. Karel Jarvis notified. Acknowledged. Orders received.

## 2021-09-28 NOTE — Evaluation (Signed)
Physical Therapy Evaluation Patient Details Name: Nichole Sanchez MRN: [id number] DOB: 1940/02/28 Today's Date: 09/28/2021  History of Present Illness  admitted for acute hospitalization s/p L TKR, WBAT (09/28/21)  Clinical Impression  Patient resting in bed upon arrival to session; supportive husband and daughter at bedside.  Patient alert and oriented, follows commands and agreeable to participation with session.  Rates L knee pain 4/10; meds received prior to session.  Demonstrates fair/good post-op strength (3-/5) and ROM (6-85 degrees) to L knee; fair/good isolated strength and control noted with supine therex.  Able to complete bed mobility with supervision; sit/stand, basic transfers, and bed/chair with RW, min assist.  Fair loading and stance phase to L LE; no overt buckling noted.  Does demonstrate effortful advancement of L LE, but anticipate rapid improvement with progressive mobility efforts. Would benefit from skilled PT to address above deficits and promote optimal return to PLOF.; recommend transition to home with follow up therapy per surgeon.     Recommendations for follow up therapy are one component of a multi-disciplinary discharge planning process, led by the attending physician.  Recommendations may be updated based on patient status, additional functional criteria and insurance authorization.  Follow Up Recommendations Follow physician's recommendations for discharge plan and follow up therapies      Assistance Recommended at Discharge PRN  Patient can return home with the following  A little help with walking and/or transfers;A little help with bathing/dressing/bathroom    Equipment Recommendations  (has RW; denies need for [facility])  Recommendations for Other Services       Functional Status Assessment Patient has had a recent decline in their functional status and demonstrates the ability to make significant improvements in function in a reasonable and predictable  amount of time.     Precautions / Restrictions Precautions Precautions: Fall Restrictions Weight Bearing Restrictions: Yes      Mobility  Bed Mobility Overal bed mobility: Needs Assistance Bed Mobility: Supine to Sit     Supine to sit: Supervision          Transfers Overall transfer level: Needs assistance Equipment used: Rolling walker (2 wheels) Transfers: Sit to/from Stand, Bed to chair/wheelchair/BSC Sit to Stand: Min guard, Min assist Stand pivot transfers: Min guard, Min assist         General transfer comment: cuing for hand placement, slightly impulsive; excessive weight shift to R LE; step by step cuing for walker management and overall transfer sequencing    Ambulation/Gait               General Gait Details: deferred this date due to pain/fatigue  Stairs            Wheelchair Mobility    Modified Rankin (Stroke Patients Only)       Balance Overall balance assessment: Needs assistance Sitting-balance support: No upper extremity supported, Feet supported Sitting balance-Leahy Scale: Good     Standing balance support: Bilateral upper extremity supported Standing balance-Leahy Scale: Fair                               Pertinent Vitals/Pain Pain Assessment Pain Assessment: 0-10 Pain Score: 5  Pain Location: L knee Pain Descriptors / Indicators: Aching, Grimacing, Guarding Pain Intervention(s): Limited activity within patient's tolerance, Monitored during session, Repositioned    Home Living Family/patient expects to be discharged to:: Private residence Living Arrangements: Spouse/significant other;Children;Other relatives Available Help at Discharge: Family Type of Home: House  Home Access: Level entry       Home Layout: One level Home Equipment: Conservation officer, nature (2 wheels)      Prior Function Prior Level of Function : Independent/Modified Independent             Mobility Comments: Indep with ADLs,  household and community mobilization without assist device; denies fall history       Hand Dominance        Extremity/Trunk Assessment   Upper Extremity Assessment Upper Extremity Assessment: Overall WFL for tasks assessed    Lower Extremity Assessment Lower Extremity Assessment: Generalized weakness (L LE grossly 3-/5 throughout, limited by post-op soreness; mild residual sensory deficit (cramping, paresthesia) to L foot.  R LE grossly WFL)       Communication   Communication: No difficulties  Cognition Arousal/Alertness: Awake/alert Behavior During Therapy: WFL for tasks assessed/performed Overall Cognitive Status: Within Functional Limits for tasks assessed                                          General Comments      Exercises Total Joint Exercises Goniometric ROM: L knee: 6-85 degrees Other Exercises Other Exercises: Supine L LE therex, 1x10, active ROM: ankle pumps, quad sets, SAQs, heel slides, hip abduct/adduct and SLR.  Fair/good isolated hip/knee control, improving with repetition. Very minimal lag with L SLR   Assessment/Plan    PT Assessment Patient needs continued PT services  PT Problem List Decreased strength;Decreased range of motion;Decreased activity tolerance;Decreased balance;Decreased mobility;Decreased knowledge of use of DME;Decreased safety awareness;Decreased knowledge of precautions;Pain       PT Treatment Interventions DME instruction;Gait training;Functional mobility training;Therapeutic activities;Therapeutic exercise;Balance training;Patient/family education    PT Goals (Current goals can be found in the Care Plan section)  Acute Rehab PT Goals Patient Stated Goal: to return home PT Goal Formulation: With patient/family Time For Goal Achievement: 10/12/21 Potential to Achieve Goals: Good    Frequency BID     Co-evaluation               AM-PAC PT "6 Clicks" Mobility  Outcome Measure Help needed turning  from your back to your side while in a flat bed without using bedrails?: None Help needed moving from lying on your back to sitting on the side of a flat bed without using bedrails?: A Little Help needed moving to and from a bed to a chair (including a wheelchair)?: A Little Help needed standing up from a chair using your arms (e.g., wheelchair or bedside chair)?: A Little Help needed to walk in hospital room?: A Little Help needed climbing 3-5 steps with a railing? : A Lot 6 Click Score: 18    End of Session Equipment Utilized During Treatment: Gait belt Activity Tolerance: Patient tolerated treatment well Patient left: in chair;with call bell/phone within reach;with chair alarm set;with family/visitor present Nurse Communication: Mobility status PT Visit Diagnosis: Muscle weakness (generalized) (M62.81);Difficulty in walking, not elsewhere classified (R26.2);Pain Pain - Right/Left: Left Pain - part of body: Knee    Time: 1411-1449 PT Time Calculation (min) (ACUTE ONLY): 38 min   Charges:   PT Evaluation $PT Eval Moderate Complexity: 1 Mod PT Treatments $Therapeutic Exercise: 8-22 mins        Juanjose Mojica H. Owens Shark, PT, DPT, NCS 09/28/21, 4:06 PM [phone]

## 2021-09-28 NOTE — Anesthesia Procedure Notes (Signed)
Spinal  Patient location during procedure: OR Start time: 09/28/2021 7:35 AM End time: 09/28/2021 7:37 AM Reason for block: surgical anesthesia Staffing Performed: anesthesiologist  Anesthesiologist: Darrin Nipper, MD Performed by: Darrin Nipper, MD Authorized by: Darrin Nipper, MD   Preanesthetic Checklist Completed: patient identified, IV checked, site marked, risks and benefits discussed, surgical consent, monitors and equipment checked, pre-op evaluation and timeout performed Spinal Block Patient position: sitting Prep: ChloraPrep Patient monitoring: heart rate, cardiac monitor, continuous pulse ox and blood pressure Approach: midline Location: L2-3 Injection technique: single-shot Needle Needle type: Sprotte  Needle gauge: 24 G Needle length: 9 cm Assessment Sensory level: T4 Events: CSF return Additional Notes Straightforward placement without apparent complications.

## 2021-09-28 NOTE — Plan of Care (Signed)

## 2021-09-28 NOTE — Anesthesia Preprocedure Evaluation (Addendum)
Anesthesia Evaluation  Patient identified by MRN, date of birth, ID band Patient awake    Reviewed: Allergy & Precautions, NPO status , Patient's Chart, lab work & pertinent test results  History of Anesthesia Complications Negative for: history of anesthetic complications  Airway Mallampati: I   Neck ROM: Full    Dental  (+) Upper Dentures, Partial Lower   Pulmonary asthma , former smoker (quit 1976),    Pulmonary exam normal breath sounds clear to auscultation       Cardiovascular hypertension, + CAD (s/p stents)  Normal cardiovascular exam Rhythm:Regular Rate:Normal  ECG 09/19/21: normal  MYOCARDIAL PERFUSION 06/23/19: 1. LVEF 68% 2. Normal myocardial thickening and wall motion 3. No artifacts noted 4. Left ventricular cavity size normal 5. No evidence of stress-induced myocardial ischemia or arrhythmia 6. Normal low risk study  Echo 06/19/19: 1. LVEF >55% 2. Normal left ventricular systolic function 3. Normal right ventricular systolic function 4. Trivial AR, TR, and PR 5. Mild MR 6. No evidence of valvular stenosis 7. No pericardial effusion   Neuro/Psych  Headaches,    GI/Hepatic GERD  ,  Endo/Other  Hypothyroidism Obesity   Renal/GU negative Renal ROS     Musculoskeletal  (+) Arthritis ,   Abdominal   Peds  Hematology  (+) Blood dyscrasia, anemia ,   Anesthesia Other Findings Reviewed and agree with Bayard Males pre-anesthesia clinical review note.   Cardiology note 09/19/21:  81 y.o. female with  1. Coronary artery disease involving native coronary artery of native heart without angina pectoris  2. S/P right coronary artery (RCA) stent placement  3. Aortic atherosclerosis (CMS-HCC)  4. Primary hypertension  5. Mixed hyperlipidemia  6. Former tobacco use  7. Obesity, Class I, BMI 30-34.9, unspecified  8. Mild intermittent asthma without status asthmaticus without complication  9. Preop  cardiovascular exam    #CAD, without angina, chronic, stable # s/p PCI/DES to RCA (2019) #Aortic atherosclerosis #Hypertension, chronic, well controlled #Hyperlipidemia, chronic, fairly controlled #Former tobacco use #Obesity class I The patient has been without any chest pain or anginal-like symptoms at this time. Her ECG on today 09/19/2021 reveals normal sinus rhythm with a heart rate of 75 bpm, poor R wave progression and no evidence of acute ischemic changes. Her most recent stress test from 06/2019 revealed a negative Lexiscan stress with normal LV function and considered a low risk study. During her previous visit on 01/27/2020 she was scheduled for noninvasive cardiac diagnostic testing; however, she did not return to have the studies completed. Her symptoms had resolved at that time. -No further cardiac work-up recommended at this time. -Continue aspirin, candesartan and rosuvastatin. -No beta-blocker therapy recommended at this time due to patient's history of bradycardia. -Continue amlodipine for blood pressure management. - -Counseling discussion included heart healthy lifestyle inclusive of avoidance of tobacco products, weight control, blood pressure and blood sugar control, and regular exercise regimen to include daily chair exercise for least 150 minutes per week, as tolerated, at a moderate intensity. - -Recommend following DASH diet and monitoring blood pressure daily at home.  - -Recommend yearly lipid profile with regularly scheduled labs via PCP.  #Mild intermittent asthma Chronic, stable. The patient's previous symptoms of dyspnea have resolved at this time. She is in no apparent respiratory distress on today and her oxygen saturation are at 96% on room air. -Management per pulmonary.  #Preoperative cardiovascular clearance -The patient is cleared as an intermediate risk for total knee replacement surgery from a cardiac standpoint.  Return  in about 6 months (around  03/20/2022). Discussed return precautions with patient for acute and chronic cardiovascular issues.   Reproductive/Obstetrics                            Anesthesia Physical Anesthesia Plan  ASA: 3  Anesthesia Plan: General and Spinal   Post-op Pain Management:    Induction: Intravenous  PONV Risk Score and Plan: 3 and Propofol infusion, TIVA, Treatment may vary due to age or medical condition and Ondansetron  Airway Management Planned: Natural Airway and Nasal Cannula  Additional Equipment:   Intra-op Plan:   Post-operative Plan:   Informed Consent: I have reviewed the patients History and Physical, chart, labs and discussed the procedure including the risks, benefits and alternatives for the proposed anesthesia with the patient or authorized representative who has indicated his/her understanding and acceptance.       Plan Discussed with: CRNA  Anesthesia Plan Comments: (Plan for spinal and GA with natural airway, LMA/GETA backup.  Patient consented for risks of anesthesia including but not limited to:  - adverse reactions to medications - damage to eyes, teeth, lips or other oral mucosa - nerve damage due to positioning  - sore throat or hoarseness - headache, bleeding, infection, nerve damage 2/2 spinal - damage to heart, brain, nerves, lungs, other parts of body or loss of life  Informed patient about role of CRNA in peri- and intra-operative care.  Patient voiced understanding.)        Anesthesia Quick Evaluation

## 2021-09-29 DIAGNOSIS — M1712 Unilateral primary osteoarthritis, left knee: Secondary | ICD-10-CM | POA: Diagnosis not present

## 2021-09-29 LAB — CBC
HCT: 38.8 % (ref 36.0–46.0)
Hemoglobin: 13.9 g/dL (ref 12.0–15.0)
MCH: 30.8 pg (ref 26.0–34.0)
MCHC: 35.8 g/dL (ref 30.0–36.0)
MCV: 86 fL (ref 80.0–100.0)
Platelets: 261 10*3/uL (ref 150–400)
RBC: 4.51 MIL/uL (ref 3.87–5.11)
RDW: 11.9 % (ref 11.5–15.5)
WBC: 13.8 10*3/uL — ABNORMAL HIGH (ref 4.0–10.5)
nRBC: 0 % (ref 0.0–0.2)

## 2021-09-29 LAB — CREATININE, SERUM
Creatinine, Ser: 0.79 mg/dL (ref 0.44–1.00)
GFR, Estimated: >60 mL/min (ref >60)

## 2021-09-29 MED ORDER — DOCUSATE SODIUM 100 MG PO CAPS: 100.0000 mg | ORAL_CAPSULE | Freq: Two times a day (BID) | ORAL | 0 refills | Status: DC

## 2021-09-29 MED ORDER — CELECOXIB 200 MG PO CAPS: 200.0000 mg | ORAL_CAPSULE | Freq: Two times a day (BID) | ORAL | 0 refills | Status: AC

## 2021-09-29 MED ORDER — TRAMADOL HCL 50 MG PO TABS: 50.0000 mg | ORAL_TABLET | Freq: Four times a day (QID) | ORAL | 0 refills | Status: DC | PRN

## 2021-09-29 MED ORDER — ENOXAPARIN SODIUM 40 MG/0.4ML IJ SOSY: 40.0000 mg | PREFILLED_SYRINGE | INTRAMUSCULAR | 0 refills | Status: DC

## 2021-09-29 NOTE — Discharge Summary (Addendum)
Physician Discharge Summary  Patient ID: Nichole Sanchez MRN: [id number] DOB/AGE: August 02, 1940 81 y.o.  Admit date: 09/28/2021 Discharge date: 09/29/2021  Admission Diagnoses:  Localized osteoarthritis of left knee [M17.12]   Discharge Diagnoses: Patient Active Problem List   Diagnosis Date Noted   Localized osteoarthritis of left knee 09/28/2021   CAD (coronary artery disease) 03/12/2017    Past Medical History:  Diagnosis Date   Adenomatous polyps    Anemia    Aortic atherosclerosis (HCC)    Arthritis    Basal cell carcinoma (BCC) of skin of face    Bronchitis    Carpal tunnel syndrome    Coronary artery disease    a.) LHC 03/19/2017 --> 99% mRCA, 99% dRCA; 2.50 x 18 mm Xience Sierra DES x 1 to [facility]; distal lesion treated medically.   Diastolic dysfunction 57/26/2035   a.) TTE 02/23/2017: EF >55%, mild LVH, mild MR/TR, G1DD; b.) TTE 06/19/2019: EF >55%, mild LAE, triv AR/TR/PR, mild MR, G1DD.   Diverticulosis    Dyspnea    Full dentures    GERD (gastroesophageal reflux disease)    Heart murmur    Hemorrhoids    a.) s/p banding in 2011   Hyperlipidemia    Hypertension    Hypothyroidism    Lumbar stenosis with neurogenic claudication    Migraines    Mild intermittent asthma    a.) well controlled; no MDIs   Pneumonia      Transfusion: none   Consultants (if any):   Discharged Condition: Improved  Hospital Course: Nichole Sanchez is an 81 y.o. female who was admitted 09/28/2021 with a diagnosis of Localized osteoarthritis of left knee and went to the operating room on 09/28/2021 and underwent the above named procedures.    Surgeries: Procedure(s): TOTAL KNEE ARTHROPLASTY on 09/28/2021 Patient tolerated the surgery well. Taken to PACU where she was stabilized and then transferred to the orthopedic floor.  Started on Lovenox 30 mg q 12 hrs.  TED hose and SCDs, heels elevated on bed with rolled towels. No evidence of DVT. Negative Homan. Physical therapy started  on day #1 for gait training and transfer. OT started day #1 for ADL and assisted devices.  Patient's IV was d/c on day #1.  On post op day #1 patient was stable and ready for discharge to home with home health PT.  Implants: Femur: size 6 CR Titanium w/ 14x29m stubby extension  Tibia: size D   Poly: 13 MC  Patella 32x8.5 cemented  She was given perioperative antibiotics:  Anti-infectives (From admission, onward)    Start     Dose/Rate Route Frequency Ordered Stop   09/28/21 1400  ceFAZolin (ANCEF) IVPB 2g/100 mL premix        2 g 200 mL/hr over 30 Minutes Intravenous Every 6 hours 09/28/21 1150 09/29/21 0159   09/28/21 0617  ceFAZolin (ANCEF) 2-4 GM/100ML-% IVPB       Note to Pharmacy: HLorenza CambridgeJ: cabinet override      09/28/21 0617 09/28/21 0757   09/28/21 0600  ceFAZolin (ANCEF) IVPB 2g/100 mL premix        2 g 200 mL/hr over 30 Minutes Intravenous On call to O.R. 09/27/21 2213 09/28/21 05974    .  She was given sequential compression devices, early ambulation, and Lovenox, teds for DVT prophylaxis.  She benefited maximally from the hospital stay and there were no complications.    Recent vital signs:  Vitals:   09/29/21 0000 09/29/21 01638  BP: (!) 153/67 134/65  Pulse: 81 70  Resp: 17 17  Temp:  (!) 97.5 F (36.4 C)  SpO2: 95% 96%    Recent laboratory studies:  Lab Results  Component Value Date   HGB 13.9 09/29/2021   HGB 13.6 09/28/2021   HGB 13.9 09/19/2020   Lab Results  Component Value Date   WBC 13.8 (H) 09/29/2021   PLT 261 09/29/2021   Lab Results  Component Value Date   INR 1.0 09/19/2020   Lab Results  Component Value Date   NA 138 09/28/2021   K 4.3 09/28/2021   CL 103 09/28/2021   CO2 22 09/19/2020   BUN 16 09/28/2021   CREATININE 0.79 09/29/2021   GLUCOSE 85 09/28/2021    Discharge Medications:   Allergies as of 09/29/2021       Reactions   Propylene Glycol Rash, Other (See Comments)   blisters   Tricor [fenofibrate]  Shortness Of Breath   Increases blood pressure   Tussin Cough [dextromethorphan Hbr] Rash, Other (See Comments)   Propylene glychol Increases blood pressure   Advair Diskus [fluticasone-salmeterol] Other (See Comments)   blisters   Albuterol Sulfate Swelling   Benzalkonium Chloride Other (See Comments)   Unknown   Cyclobenzaprine Other (See Comments)   Unknown   Hctz [hydrochlorothiazide] Other (See Comments)   blisters   Neosporin [neomycin-bacitracin Zn-polymyx] Other (See Comments)   blisters   Nickel Other (See Comments)   Itching, blister and raw rash   Prednisone Other (See Comments)   Can not take in large doses   Proair Hfa [albuterol] Swelling   Codeine Nausea And Vomiting, Rash   Esomeprazole Rash, Other (See Comments)   Writing on pill has propylene glychol   Etodolac Palpitations   Latex Rash   Naproxen Palpitations   Nystatin Rash   Oxycodone-acetaminophen Rash   Patient reports that she can take tylenol   Polysporin [bacitracin-polymyxin B] Rash   Tape Rash   Dermabond- Blisters   Tussionex Pennkinetic Er [hydrocod Poli-chlorphe Poli Er] Rash        Medication List     STOP taking these medications    meloxicam 15 MG tablet Commonly known as: MOBIC       TAKE these medications    acetaminophen 500 MG tablet Commonly known as: TYLENOL Take 1,000 mg by mouth every 6 (six) hours as needed for moderate pain or headache.   alum hydroxide-mag trisilicate 47-82 MG Chew chewable tablet Commonly known as: GAVISCON Chew 1 tablet by mouth as needed for indigestion or heartburn.   amLODipine 2.5 MG tablet Commonly known as: NORVASC Take 2.5 mg by mouth at bedtime.   aspirin EC 81 MG tablet Take 81 mg by mouth daily. Swallow whole.   AZO-CRANBERRY PO Take 2 capsules by mouth daily.   BIOTIN PO Take 1 capsule by mouth daily.   candesartan 32 MG tablet Commonly known as: ATACAND Take 32 mg by mouth daily.   celecoxib 200 MG capsule Commonly  known as: CELEBREX Take 1 capsule (200 mg total) by mouth 2 (two) times daily for 14 days.   docusate sodium 100 MG capsule Commonly known as: COLACE Take 1 capsule (100 mg total) by mouth 2 (two) times daily.   enoxaparin 40 MG/0.4ML injection Commonly known as: LOVENOX Inject 0.4 mLs (40 mg total) into the skin daily for 14 days.   Fish Oil 1000 MG Caps Take 1,000 mg by mouth 2 (two) times daily.   levothyroxine 88 MCG  tablet Commonly known as: SYNTHROID Take 88 mcg by mouth daily before breakfast.   loratadine 10 MG tablet Commonly known as: CLARITIN Take 10 mg by mouth in the morning.   meclizine 25 MG tablet Commonly known as: ANTIVERT Take 25 mg by mouth 3 (three) times daily as needed for dizziness.   methocarbamol 500 MG tablet Commonly known as: ROBAXIN Take 1,000 mg by mouth daily as needed (Back pain).   rosuvastatin 5 MG tablet Commonly known as: CRESTOR Take 5 mg by mouth daily.   SUPER B COMPLEX PO Take 1 tablet by mouth daily with lunch.   traMADol 50 MG tablet Commonly known as: ULTRAM Take 1-2 tablets (50-100 mg total) by mouth every 6 (six) hours as needed for moderate pain. No more than 8 in a 24-hour.   Vitamin D 50 MCG (2000 UT) Caps Take 2,000 Units by mouth daily.   vitamin E 180 MG (400 UNITS) capsule Take 400 Units by mouth daily.               Durable Medical Equipment  (From admission, onward)           Start     Ordered   09/29/21 0950  For home use only DME Bedside commode  Once       Question:  Patient needs a bedside commode to treat with the following condition  Answer:  Impaired mobility   09/29/21 0949            Diagnostic Studies: DG Knee Left Port  Result Date: 09/28/2021 CLINICAL DATA:  Left knee replacement EXAM: PORTABLE LEFT KNEE - 1-2 VIEW COMPARISON:  Left knee 01/29/2008 FINDINGS: Left knee replacement in satisfactory position alignment. No fracture or complication. Gas and fluid in the knee joint.  IMPRESSION: Satisfactory left knee replacement. Electronically Signed   By: Franchot Gallo M.D.   On: 09/28/2021 11:05    Disposition: Discharge disposition: 06-Home-Health Care Svc          Follow-up Information     Duanne Guess, PA-C Follow up in 2 week(s).   Specialties: Orthopedic Surgery, Emergency Medicine Contact information: [place] [address] [phone]                  Signed: Feliberto Gottron 09/29/2021, 12:39 PM

## 2021-09-29 NOTE — Progress Notes (Signed)
   Subjective: 1 Day Post-Op Procedure(s) (LRB): TOTAL KNEE ARTHROPLASTY (Left) Patient reports pain as mild.   Patient is well, and has had no acute complaints or problems Denies any CP, SOB, ABD pain. We will continue therapy today.  Plan is to go Home after hospital stay.  Objective: Vital signs in last 24 hours: Temp:  [97.1 F (36.2 C)-97.9 F (36.6 C)] 97.7 F (36.5 C) (09/28 2010) Pulse Rate:  [66-94] 81 (09/29 0000) Resp:  [11-21] 17 (09/29 0000) BP: (116-153)/(52-68) 153/67 (09/29 0000) SpO2:  [93 %-97 %] 95 % (09/29 0000)  Intake/Output from previous day: 09/28 0701 - 09/29 0700 In: 1615 [P.O.:480; I.V.:1035; IV Piggyback:100] Out: 225 [Urine:175; Blood:50] Intake/Output this shift: No intake/output data recorded.  Recent Labs    09/28/21 0627  HGB 13.6   Recent Labs    09/28/21 0627  HCT 40.0   Recent Labs    09/28/21 0627  NA 138  K 4.3  CL 103  BUN 16  CREATININE 0.80  GLUCOSE 85   No results for input(s): "LABPT", "INR" in the last 72 hours.  EXAM General - Patient is Alert, Appropriate, and Oriented Extremity - Neurovascular intact Sensation intact distally Intact pulses distally Dorsiflexion/Plantar flexion intact Incision: dressing C/D/I and no drainage No cellulitis present Dressing - dressing C/D/I.  No drainage. Motor Function - intact, moving foot and toes well on exam.   Past Medical History:  Diagnosis Date   Adenomatous polyps    Anemia    Aortic atherosclerosis (HCC)    Arthritis    Basal cell carcinoma (BCC) of skin of face    Bronchitis    Carpal tunnel syndrome    Coronary artery disease    a.) LHC 03/19/2017 --> 99% mRCA, 99% dRCA; 2.50 x 18 mm Xience [place] DES x 1 to [facility]; distal lesion treated medically.   Diastolic dysfunction 16/10/9602   a.) TTE 02/23/2017: EF >55%, mild LVH, mild MR/TR, G1DD; b.) TTE 06/19/2019: EF >55%, mild LAE, triv AR/TR/PR, mild MR, G1DD.   Diverticulosis    Dyspnea    Full dentures     GERD (gastroesophageal reflux disease)    Heart murmur    Hemorrhoids    a.) s/p banding in 2011   Hyperlipidemia    Hypertension    Hypothyroidism    Lumbar stenosis with neurogenic claudication    Migraines    Mild intermittent asthma    a.) well controlled; no MDIs   Pneumonia     Assessment/Plan:   1 Day Post-Op Procedure(s) (LRB): TOTAL KNEE ARTHROPLASTY (Left) Principal Problem:   Localized osteoarthritis of left knee  Estimated body mass index is 33.43 kg/m as calculated from the following:   Height as of this encounter: '5\' 2"'$  (1.575 m).   Weight as of this encounter: 82.9 kg. Advance diet Up with therapy Pain well controlled Vital signs are stable Labs are stable Care management to assist with discharge to home with home health PT today pending completion of PT goals  DVT Prophylaxis - Aspirin, Lovenox, TED hose, and SCDS Weight-Bearing as tolerated to left leg   T. Rachelle Hora, PA-C [place] 09/29/2021, 7:52 AM

## 2021-09-29 NOTE — Discharge Instructions (Signed)
   Instructions after Total Knee Replacement   Steffanie Rainwater M.D.     Dept. of [facility]  [address] [place], [place]  [address]  Phone: [phone]   Fax: [phone]    DIET: Drink plenty of non-alcoholic fluids. Resume your normal diet. Include foods high in fiber.  ACTIVITY:  You may use crutches or a walker with weight-bearing as tolerated, unless instructed otherwise. You may be weaned off of the walker or crutches by your Physical Therapist.  Do NOT place pillows under the knee. Anything placed under the knee could limit your ability to straighten the knee.   Continue doing gentle exercises. Exercising will reduce the pain and swelling, increase motion, and prevent muscle weakness.   Please continue to use the TED compression stockings for 6 weeks. You may remove the stockings at night, but should reapply them in the morning. Do not drive or operate any equipment until instructed.  WOUND CARE:  Continue to use the PolarCare or ice packs periodically to reduce pain and swelling. You may begin showering 3 days after surgery with honeycomb dressing. Remove honeycomb dressing 7 days after surgery and continue showering. Allow dermabond to fall off on its own.  MEDICATIONS: You may resume your regular medications. Please take the pain medication as prescribed on the medication. Do not take pain medication on an empty stomach. You have been given a prescription for a blood thinner (Lovenox or Coumadin). Please take the medication as instructed. (NOTE: After completing a 2 week course of Lovenox, take one Enteric-coated aspirin once a day. This along with elevation will help reduce the possibility of phlebitis in your operated leg.) Do not drive or drink alcoholic beverages when taking pain medications.  CALL THE OFFICE FOR: Temperature above 101 degrees Excessive bleeding or drainage on the dressing. Excessive swelling, coldness,  or paleness of the toes. Persistent nausea and vomiting.  FOLLOW-UP:  You should have an appointment to return to the office in 14 days after surgery. Arrangements have been made for continuation of Physical Therapy (either home therapy or outpatient therapy).

## 2021-09-29 NOTE — Plan of Care (Signed)

## 2021-09-29 NOTE — Progress Notes (Signed)
Spoke with the patient She lives at home with her husband Her daughter will provide transport She has a rolling walker at home but needs a 3 in 1, [employer] to deliver at the bedside prior to Walker is set up for [facility]

## 2021-09-29 NOTE — Progress Notes (Signed)
Patient is not able to walk the distance required to go the bathroom, or he/she is unable to safely negotiate stairs required to access the bathroom.  A 3in1 BSC will alleviate this problem  

## 2021-09-29 NOTE — Progress Notes (Signed)
Physical Therapy Treatment Patient Details Name: Nichole Sanchez MRN: [id number] DOB: 02-07-1940 Today's Date: 09/29/2021   History of Present Illness admitted for acute hospitalization s/p L TKR, WBAT (09/28/21)    PT Comments    Patient up in chair upon arrival to room; alert and eager for progression towards discharge home. L knee pain well-controlled (2-3/10), meds received prior to session. Demonstrates excellent progress towards all mobility goals, completing full lap around nursing station and completing stairs required for access to home environment, both with no greater than cga/close sup level of assist from therapist.  Patient with good L knee control and stability; good safety awareness and insight.   Patient comfortable with functional performance and education provided during session; safe for discharge home when medically appropriate. RN, MD, TOC informed/aware.     Recommendations for follow up therapy are one component of a multi-disciplinary discharge planning process, led by the attending physician.  Recommendations may be updated based on patient status, additional functional criteria and insurance authorization.  Follow Up Recommendations  Follow physician's recommendations for discharge plan and follow up therapies     Assistance Recommended at Discharge PRN  Patient can return home with the following A little help with walking and/or transfers;A little help with bathing/dressing/bathroom   Equipment Recommendations       Recommendations for Other Services       Precautions / Restrictions Precautions Precautions: Fall Restrictions Weight Bearing Restrictions: Yes LLE Weight Bearing: Weight bearing as tolerated     Mobility  Bed Mobility               General bed mobility comments: seated in recliner beginning/end of session    Transfers Overall transfer level: Needs assistance Equipment used: Rolling walker (2 wheels) Transfers: Sit to/from  Stand Sit to Stand: Supervision           General transfer comment: good hand placement, good fluidity of movement with sit/stand    Ambulation/Gait Ambulation/Gait assistance: Supervision Gait Distance (Feet): 200 Feet Assistive device: Rolling walker (2 wheels)   Gait velocity: 10' walk time, 10 seconds Gait velocity interpretation: <1.31 ft/sec, indicative of household ambulator   General Gait Details: good progression to reciprocal stepping pattern with good loading/stance time L LE; min cuing for L hip/knee flexion in swing and for heel strike/toe off as appropriate.  Mild L hip retraction in terminal stance, anticipate improvement in mechanics as pain/functional ROM continue to improve.   Stairs Stairs: Yes Stairs assistance: Min guard, Supervision Stair Management: Two rails Number of Stairs: 4 General stair comments: step to gait pattern, min cuing for technique.  Good stability in modified L SLS   Wheelchair Mobility    Modified Rankin (Stroke Patients Only)       Balance Overall balance assessment: Needs assistance Sitting-balance support: No upper extremity supported, Feet supported Sitting balance-Leahy Scale: Good     Standing balance support: Bilateral upper extremity supported Standing balance-Leahy Scale: Fair                              Cognition Arousal/Alertness: Awake/alert Behavior During Therapy: WFL for tasks assessed/performed Overall Cognitive Status: Within Functional Limits for tasks assessed                                          Exercises Total Joint Exercises Goniometric  ROM: L knee: 3-90 degrees Other Exercises Other Exercises: Issued and reviewed HEP for L LE therex outside of therapy; patient voiced understanding. Other Exercises: Seated LE therex, 1x10: ankle pumps, LAQs; standing L LE therex, 1x10: functional hip/knee flexion (toe-taps on step) with bilat UE support. Other Exercises: Reviewed  car transfer technique, educated in use of polar care, reviewed importance of positioning and overall activity progression; patient voiced understanding of all information.    General Comments        Pertinent Vitals/Pain Pain Assessment Pain Assessment: 0-10 Pain Score: 3  Pain Location: L knee Pain Descriptors / Indicators: Aching, Grimacing, Guarding Pain Intervention(s): Limited activity within patient's tolerance, Monitored during session, Premedicated before session, Repositioned    Home Living                          Prior Function            PT Goals (current goals can now be found in the care plan section) Acute Rehab PT Goals Patient Stated Goal: to return home PT Goal Formulation: With patient/family Time For Goal Achievement: 10/12/21 Potential to Achieve Goals: Good Progress towards PT goals: Progressing toward goals    Frequency    BID      PT Plan Current plan remains appropriate    Co-evaluation              AM-PAC PT "6 Clicks" Mobility   Outcome Measure  Help needed turning from your back to your side while in a flat bed without using bedrails?: None Help needed moving from lying on your back to sitting on the side of a flat bed without using bedrails?: None Help needed moving to and from a bed to a chair (including a wheelchair)?: None Help needed standing up from a chair using your arms (e.g., wheelchair or bedside chair)?: None Help needed to walk in hospital room?: None Help needed climbing 3-5 steps with a railing? : A Little 6 Click Score: 23    End of Session Equipment Utilized During Treatment: Gait belt Activity Tolerance: Patient tolerated treatment well Patient left: in chair;with call bell/phone within reach;with chair alarm set Nurse Communication: Mobility status PT Visit Diagnosis: Muscle weakness (generalized) (M62.81);Difficulty in walking, not elsewhere classified (R26.2);Pain Pain - Right/Left: Left Pain  - part of body: Knee     Time: 6659-9357 PT Time Calculation (min) (ACUTE ONLY): 28 min  Charges:  $Gait Training: 8-22 mins $Therapeutic Activity: 8-22 mins                    Abhi Moccia H. Owens Shark, PT, DPT, NCS 09/29/21, 9:50 AM [phone]

## 2021-09-29 NOTE — Plan of Care (Signed)
Problem: Education: Goal: Knowledge of the prescribed therapeutic regimen will improve 09/29/2021 1241 by Alferd Apa, RN Outcome: Adequate for Discharge 09/29/2021 0804 by Alferd Apa, RN Outcome: Progressing Goal: Individualized Educational Video(s) 09/29/2021 1241 by Alferd Apa, RN Outcome: Adequate for Discharge 09/29/2021 0804 by Alferd Apa, RN Outcome: Progressing   Problem: Activity: Goal: Ability to avoid complications of mobility impairment will improve 09/29/2021 1241 by Alferd Apa, RN Outcome: Adequate for Discharge 09/29/2021 0804 by Alferd Apa, RN Outcome: Progressing Goal: Range of joint motion will improve 09/29/2021 1241 by Alferd Apa, RN Outcome: Adequate for Discharge 09/29/2021 0804 by Alferd Apa, RN Outcome: Progressing   Problem: Clinical Measurements: Goal: Postoperative complications will be avoided or minimized 09/29/2021 1241 by Alferd Apa, RN Outcome: Adequate for Discharge 09/29/2021 0804 by Alferd Apa, RN Outcome: Progressing   Problem: Pain Management: Goal: Pain level will decrease with appropriate interventions 09/29/2021 1241 by Alferd Apa, RN Outcome: Adequate for Discharge 09/29/2021 0804 by Alferd Apa, RN Outcome: Progressing   Problem: Skin Integrity: Goal: Will show signs of wound healing 09/29/2021 1241 by Alferd Apa, RN Outcome: Adequate for Discharge 09/29/2021 0804 by Alferd Apa, RN Outcome: Progressing   Problem: Education: Goal: Knowledge of General Education information will improve Description: Including pain rating scale, medication(s)/side effects and non-pharmacologic comfort measures 09/29/2021 1241 by Alferd Apa, RN Outcome: Adequate for Discharge 09/29/2021 0804 by Alferd Apa, RN Outcome: Progressing   Problem: Health Behavior/Discharge Planning: Goal: Ability to manage health-related needs will improve 09/29/2021 1241 by Alferd Apa, RN Outcome: Adequate for  Discharge 09/29/2021 0804 by Alferd Apa, RN Outcome: Progressing   Problem: Clinical Measurements: Goal: Ability to maintain clinical measurements within normal limits will improve 09/29/2021 1241 by Alferd Apa, RN Outcome: Adequate for Discharge 09/29/2021 0804 by Alferd Apa, RN Outcome: Progressing Goal: Will remain free from infection 09/29/2021 1241 by Alferd Apa, RN Outcome: Adequate for Discharge 09/29/2021 0804 by Alferd Apa, RN Outcome: Progressing Goal: Diagnostic test results will improve 09/29/2021 1241 by Alferd Apa, RN Outcome: Adequate for Discharge 09/29/2021 0804 by Alferd Apa, RN Outcome: Progressing Goal: Respiratory complications will improve 09/29/2021 1241 by Alferd Apa, RN Outcome: Adequate for Discharge 09/29/2021 0804 by Alferd Apa, RN Outcome: Progressing Goal: Cardiovascular complication will be avoided 09/29/2021 1241 by Alferd Apa, RN Outcome: Adequate for Discharge 09/29/2021 0804 by Alferd Apa, RN Outcome: Progressing   Problem: Activity: Goal: Risk for activity intolerance will decrease 09/29/2021 1241 by Alferd Apa, RN Outcome: Adequate for Discharge 09/29/2021 0804 by Alferd Apa, RN Outcome: Progressing   Problem: Nutrition: Goal: Adequate nutrition will be maintained 09/29/2021 1241 by Alferd Apa, RN Outcome: Adequate for Discharge 09/29/2021 0804 by Alferd Apa, RN Outcome: Progressing   Problem: Coping: Goal: Level of anxiety will decrease 09/29/2021 1241 by Alferd Apa, RN Outcome: Adequate for Discharge 09/29/2021 0804 by Alferd Apa, RN Outcome: Progressing   Problem: Elimination: Goal: Will not experience complications related to bowel motility 09/29/2021 1241 by Alferd Apa, RN Outcome: Adequate for Discharge 09/29/2021 0804 by Alferd Apa, RN Outcome: Progressing Goal: Will not experience complications related to urinary retention 09/29/2021 1241 by Alferd Apa, RN Outcome: Adequate  for Discharge 09/29/2021 0804 by Alferd Apa, RN Outcome: Progressing   Problem: Pain Managment: Goal: General experience of comfort will improve 09/29/2021 1241 by  Trumaine Wimer, Alfredo Batty, RN Outcome: Adequate for Discharge 09/29/2021 0804 by Alferd Apa, RN Outcome: Progressing   Problem: Safety: Goal: Ability to remain free from injury will improve 09/29/2021 1241 by Alferd Apa, RN Outcome: Adequate for Discharge 09/29/2021 0804 by Alferd Apa, RN Outcome: Progressing   Problem: Skin Integrity: Goal: Risk for impaired skin integrity will decrease 09/29/2021 1241 by Alferd Apa, RN Outcome: Adequate for Discharge 09/29/2021 0804 by Alferd Apa, RN Outcome: Progressing

## 2021-09-29 NOTE — Plan of Care (Signed)
  Problem: Education: Goal: Knowledge of the prescribed therapeutic regimen will improve 09/29/2021 0219 by Rexford Maus, RN Outcome: Progressing 09/29/2021 0219 by Rexford Maus, RN Outcome: Progressing Goal: Individualized Educational Video(s) 09/29/2021 0219 by Rexford Maus, RN Outcome: Progressing 09/29/2021 0219 by Rexford Maus, RN Outcome: Progressing   Problem: Activity: Goal: Ability to avoid complications of mobility impairment will improve Outcome: Progressing Goal: Range of joint motion will improve Outcome: Progressing   Problem: Clinical Measurements: Goal: Postoperative complications will be avoided or minimized Outcome: Progressing   Problem: Pain Management: Goal: Pain level will decrease with appropriate interventions Outcome: Progressing   Problem: Skin Integrity: Goal: Will show signs of wound healing Outcome: Progressing   Problem: Education: Goal: Knowledge of General Education information will improve Description: Including pain rating scale, medication(s)/side effects and non-pharmacologic comfort measures Outcome: Progressing   Problem: Health Behavior/Discharge Planning: Goal: Ability to manage health-related needs will improve Outcome: Progressing   Problem: Clinical Measurements: Goal: Ability to maintain clinical measurements within normal limits will improve Outcome: Progressing Goal: Will remain free from infection Outcome: Progressing Goal: Diagnostic test results will improve Outcome: Progressing Goal: Respiratory complications will improve Outcome: Progressing Goal: Cardiovascular complication will be avoided Outcome: Progressing   Problem: Activity: Goal: Risk for activity intolerance will decrease Outcome: Progressing   Problem: Nutrition: Goal: Adequate nutrition will be maintained Outcome: Progressing   Problem: Coping: Goal: Level of anxiety will decrease Outcome: Progressing   Problem: Elimination: Goal: Will not  experience complications related to bowel motility Outcome: Progressing Goal: Will not experience complications related to urinary retention Outcome: Progressing   Problem: Pain Managment: Goal: General experience of comfort will improve Outcome: Progressing   Problem: Safety: Goal: Ability to remain free from injury will improve Outcome: Progressing   Problem: Skin Integrity: Goal: Risk for impaired skin integrity will decrease Outcome: Progressing

## 2021-11-28 ENCOUNTER — Encounter: Payer: Self-pay | Admitting: Neurosurgery

## 2021-11-28 ENCOUNTER — Ambulatory Visit (INDEPENDENT_AMBULATORY_CARE_PROVIDER_SITE_OTHER): Payer: Medicare Other | Admitting: Neurosurgery

## 2021-11-28 VITALS — BP 134/78 | Ht 62.0 in | Wt 179.8 lb

## 2021-11-28 DIAGNOSIS — M5416 Radiculopathy, lumbar region: Secondary | ICD-10-CM

## 2021-11-28 NOTE — Progress Notes (Signed)
Referring Physician:  Tracie Harrier, MD [address] [facility] [place],  [place] [address]  Primary Physician:  Tracie Harrier, MD  DOS: 09/28/20 L4-5 decompression   History of Present Illness: 11/28/2021 Nichole Sanchez had her left knee replaced since I last saw her.  She is doing very well.  She is having intermittent low level pain in her right buttock.  Overall, she is doing better than she was previously.  09/21/2021 Nichole Sanchez is here today with a chief complaint of pain in her right buttock and down her leg.  She began having some mild back pain around the start of 2023.  Her left leg is still feeling very well.  She had left leg pain prior to her surgery a year ago.  She is now having discomfort in her right buttock and posterior lateral thigh.  She has some discomfort on her anterolateral calf.  Of note, she is having her left knee replaced next week.  11/10/20 Nichole Sanchez is status post lumbar decompression. She is doing very well. She has minimal leg pain. She has occasional discomfort when she drives. Overall, she is much improved.   Review of Systems:  A 10 point review of systems is negative, except for the pertinent positives and negatives detailed in the HPI.  Past Medical History: Past Medical History:  Diagnosis Date   Adenomatous polyps    Anemia    Aortic atherosclerosis (HCC)    Arthritis    Basal cell carcinoma (BCC) of skin of face    Bronchitis    Carpal tunnel syndrome    Coronary artery disease    a.) LHC 03/19/2017 --> 99% mRCA, 99% dRCA; 2.50 x 18 mm Xience Sierra DES x 1 to [facility]; distal lesion treated medically.   Diastolic dysfunction 27/03/5007   a.) TTE 02/23/2017: EF >55%, mild LVH, mild MR/TR, G1DD; b.) TTE 06/19/2019: EF >55%, mild LAE, triv AR/TR/PR, mild MR, G1DD.   Diverticulosis    Dyspnea    Full dentures    GERD (gastroesophageal reflux disease)    Heart murmur    Hemorrhoids    a.) s/p  banding in 2011   Hyperlipidemia    Hypertension    Hypothyroidism    Lumbar stenosis with neurogenic claudication    Migraines    Mild intermittent asthma    a.) well controlled; no MDIs   Pneumonia     Past Surgical History: Past Surgical History:  Procedure Laterality Date   APPENDECTOMY N/A    CARPAL TUNNEL RELEASE Right 06/25/2016   Procedure: CARPAL TUNNEL RELEASE;  Surgeon: Earnestine Leys, MD;  Location: [facility] ORS;  Service: Orthopedics;  Laterality: Right;   CATARACT EXTRACTION W/PHACO Right 08/02/2014   Procedure: CATARACT EXTRACTION PHACO AND INTRAOCULAR LENS PLACEMENT (IOC);  Surgeon: Estill Cotta, MD;  Location: [facility];  Service: Ophthalmology;  Laterality: Right;  [place]:  01:30.3 AP%: 16.4 CDE: 28.22  Fluid Lot# [id number]   CATARACT EXTRACTION W/PHACO Left 08/23/2014   Procedure: CATARACT EXTRACTION PHACO AND INTRAOCULAR LENS PLACEMENT (IOC);  Surgeon: Estill Cotta, MD;  Location: [facility];  Service: Ophthalmology;  Laterality: Left;  [place] 01:11.4 AP% 23.0 CDE 28.17 FLUID LOT# [id number]   CHOLECYSTECTOMY N/A 2011   COLONOSCOPY N/A 02/23/2009   COLONOSCOPY WITH PROPOFOL N/A 09/19/2016   Procedure: COLONOSCOPY WITH PROPOFOL;  Surgeon: Manya Silvas, MD;  Location: [facility] ENDOSCOPY;  Service: Endoscopy;  Laterality: N/A;   CORONARY STENT INTERVENTION N/A 03/12/2017   Procedure: CORONARY STENT  INTERVENTION;  Surgeon: Isaias Cowman, MD;  Location: [place] [facility];  Service: Cardiovascular;  Laterality: N/A;   DILATION AND CURETTAGE OF UTERUS     ESOPHAGOGASTRODUODENOSCOPY N/A 02/23/2009   ESOPHAGOGASTRODUODENOSCOPY N/A 03/31/2012   ESOPHAGOGASTRODUODENOSCOPY N/A 11/24/1999   ESOPHAGOGASTRODUODENOSCOPY (EGD) WITH PROPOFOL N/A 07/28/2019   Procedure: ESOPHAGOGASTRODUODENOSCOPY (EGD) WITH PROPOFOL;  Surgeon: Lesly Rubenstein, MD;  Location: [facility] ENDOSCOPY;  Service: Endoscopy;  Laterality: N/A;   EYE SURGERY Bilateral 06/2016   LASER SURGERY "FOR  CLOUDY EYES"   HEMORRHOID BANDING N/A 2011   KNEE ARTHROSCOPY Left 2014   LEFT HEART CATH AND CORONARY ANGIOGRAPHY Left 03/12/2017   Procedure: LEFT HEART CATH AND CORONARY ANGIOGRAPHY;  Surgeon: Teodoro Spray, MD;  Location: [place] [facility];  Service: Cardiovascular;  Laterality: Left;   LUMBAR LAMINECTOMY/DECOMPRESSION MICRODISCECTOMY N/A 09/28/2020   Procedure: L4-5 DECOMPRESSION;  Surgeon: Meade Maw, MD;  Location: [facility];  Service: Neurosurgery;  Laterality: N/A;   TONSILLECTOMY N/A 1963   TOTAL KNEE ARTHROPLASTY Right    TOTAL KNEE ARTHROPLASTY Left 09/28/2021   Procedure: TOTAL KNEE ARTHROPLASTY;  Surgeon: Steffanie Rainwater, MD;  Location: [facility];  Service: Orthopedics;  Laterality: Left;   TRIGGER FINGER RELEASE Left    VAGINAL HYSTERECTOMY N/A 1986   WRIST JOINT AND TENDON TRANSPLANT     late 1990s    Allergies: Allergies as of 11/28/2021 - Review Complete 11/28/2021  Allergen Reaction Noted   Propylene glycol Rash and Other (See Comments) 07/30/2014   Tricor [fenofibrate] Shortness Of Breath 08/01/2020   Tussin cough [dextromethorphan hbr] Rash and Other (See Comments) 08/02/2014   Advair diskus [fluticasone-salmeterol] Other (See Comments) 06/20/2016   Albuterol sulfate Swelling 06/20/2016   Benzalkonium chloride Other (See Comments) 06/20/2016   Cyclobenzaprine Other (See Comments) 07/30/2014   Hctz [hydrochlorothiazide] Other (See Comments) 06/20/2016   Neosporin [neomycin-bacitracin zn-polymyx] Other (See Comments) 07/30/2014   Nickel Other (See Comments) 09/14/2020   Prednisone Other (See Comments) 07/30/2014   Proair hfa [albuterol] Swelling 09/18/2016   Codeine Nausea And Vomiting and Rash 07/30/2014   Esomeprazole Rash and Other (See Comments) 06/20/2016   Etodolac Palpitations 06/20/2016   Latex Rash 06/20/2016   Naproxen Palpitations 07/30/2014   Nystatin Rash 06/20/2016   Oxycodone-acetaminophen Rash 06/20/2016   Polysporin  [bacitracin-polymyxin b] Rash 07/30/2014   Tape Rash 06/20/2016   Tussionex pennkinetic er [hydrocod poli-chlorphe poli er] Rash 09/18/2016    Medications: Current Meds  Medication Sig   acetaminophen (TYLENOL) 500 MG tablet Take 1,000 mg by mouth every 6 (six) hours as needed for moderate pain or headache.   alum hydroxide-mag trisilicate (GAVISCON) 85-02 MG CHEW chewable tablet Chew 1 tablet by mouth as needed for indigestion or heartburn.   amLODipine (NORVASC) 2.5 MG tablet Take 2.5 mg by mouth at bedtime.    aspirin EC 81 MG tablet Take 81 mg by mouth daily. Swallow whole.   AZO-CRANBERRY PO Take 2 capsules by mouth daily.   B Complex-C (SUPER B COMPLEX PO) Take 1 tablet by mouth daily with lunch.   BIOTIN PO Take 1 capsule by mouth daily.   candesartan (ATACAND) 32 MG tablet Take 32 mg by mouth daily.   Cholecalciferol (VITAMIN D) 2000 units CAPS Take 2,000 Units by mouth daily.    levothyroxine (SYNTHROID) 88 MCG tablet Take 88 mcg by mouth daily before breakfast.   loratadine (CLARITIN) 10 MG tablet Take 10 mg by mouth in the morning.   meclizine (ANTIVERT) 25 MG tablet Take 25 mg by mouth  3 (three) times daily as needed for dizziness.   meloxicam (MOBIC) 15 MG tablet Take 1/2 a tablet by mouth daily   methocarbamol (ROBAXIN) 500 MG tablet Take 1,000 mg by mouth daily as needed (Back pain).   Omega-3 Fatty Acids (FISH OIL) 1000 MG CAPS Take 1,000 mg by mouth 2 (two) times daily.    rosuvastatin (CRESTOR) 5 MG tablet Take 5 mg by mouth daily.   vitamin E 180 MG (400 UNITS) capsule Take 400 Units by mouth daily.    Social History: Social History   Tobacco Use   Smoking status: Former    Types: Cigarettes    Quit date: 1976    Years since quitting: 47.9   Smokeless tobacco: Never  Vaping Use   Vaping Use: Never used  Substance Use Topics   Alcohol use: No   Drug use: No    Family Medical History: No family history on file.  Physical Examination: Vitals:   11/28/21  1424  BP: 134/78    General: Patient is well developed, well nourished, calm, collected, and in no apparent distress. Attention to examination is appropriate.  Neck:   Supple.  Full range of motion.  Respiratory: Patient is breathing without any difficulty.   NEUROLOGICAL:     Awake, alert, oriented to person, place, and time.  Speech is clear and fluent. Fund of knowledge is appropriate.   Cranial Nerves: Pupils equal round and reactive to light.  Facial tone is symmetric.  Facial sensation is symmetric. Shoulder shrug is symmetric. Tongue protrusion is midline.  There is no pronator drift.  ROM of spine: full.    Strength: Side Biceps Triceps Deltoid Interossei Grip Wrist Ext. Wrist Flex.  R '5 5 5 5 5 5 5  '$ L '5 5 5 5 5 5 5   '$ Side Iliopsoas Quads Hamstring PF DF EHL  R '5 5 5 5 5 5  '$ L '5 5 5 5 5 5   '$ Reflexes are 1+ and symmetric at the biceps, triceps, brachioradialis, patella and achilles.   Hoffman's is absent.  Clonus is not present.  Toes are down-going.  Bilateral upper and lower extremity sensation is intact to light touch.    No evidence of dysmetria noted.  Gait is normal.    Medical Decision Making  Imaging: No new imaging to review.  Her old imaging was reviewed.  I have personally reviewed the images and agree with the above interpretation.  Assessment and Plan: Nichole Sanchez is a pleasant 81 y.o. female with mild low back pain with possible right-sided radicular pain versus right hip pain.  It is currently under control.  She is doing much better since she had her left knee replaced.  She will continue her current regimen of pain management with Tylenol and methocarbamol.  I will see her back on an as-needed basis.     I spent a total of 10 minutes in face-to-face and non-face-to-face activities related to this patient's care today.  Thank you for involving me in the care of this patient.      Jyssica Rief K. Izora Ribas MD, [facility]

## 2022-03-24 IMAGING — MR MR CERVICAL SPINE W/O CM
5 series · 38 of 48 positions shown · non-contrast
Comparison: None.

CLINICAL DATA: Neck pain

EXAM:
MRI CERVICAL SPINE WITHOUT CONTRAST
TECHNIQUE: Multiplanar, multisequence MR imaging of the cervical spine was
performed. No intravenous contrast was administered.

[Series 5: T2 · sagittal · 3.0mm · 0.62mm/px · 6 of 15 slices shown (1 of 2)]
[im 1/15]
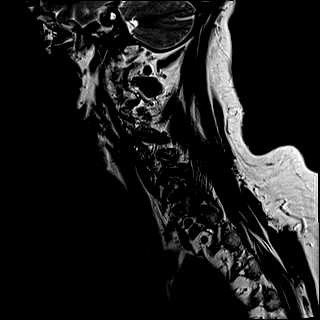
[im 3/15]
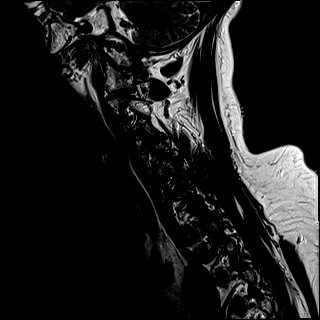
[im 6/15]
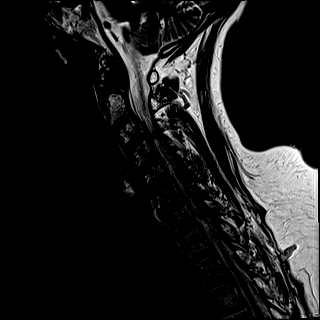
[im 9/15]
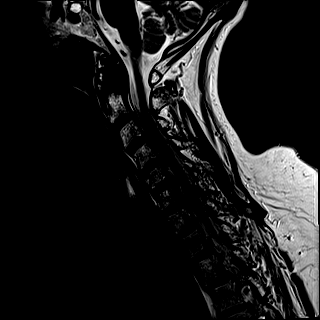
[im 12/15]
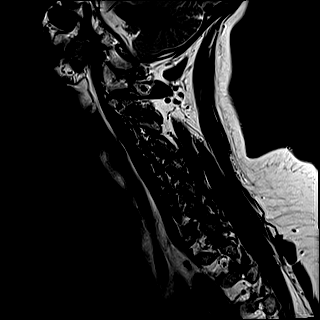
[im 15/15]
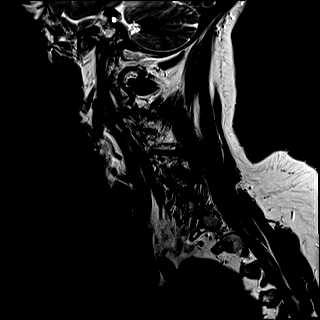

[Series 6: FLAIR · sagittal · 3.0mm · 0.78mm/px · 7 of 15 slices shown]
[im 1/15]
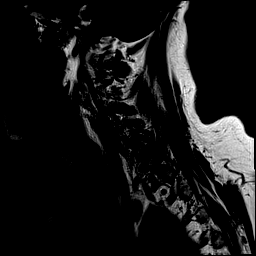
[im 3/15]
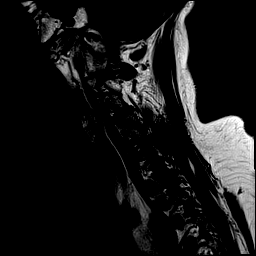
[im 5/15]
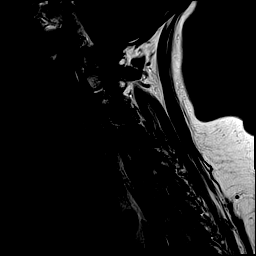
[im 8/15]
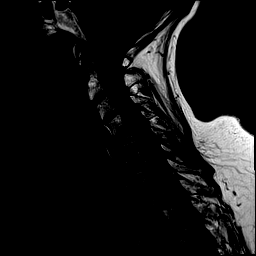
[im 10/15]
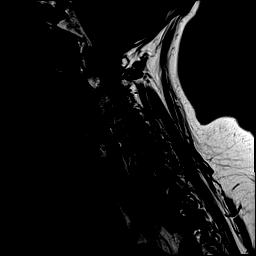
[im 12/15]
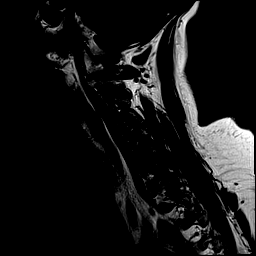
[im 15/15]
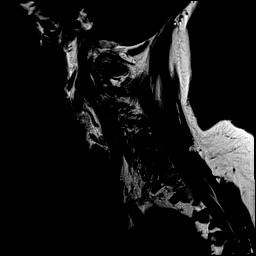

[Series 7: STIR · sagittal · 3.0mm · 0.62mm/px · 7 of 15 slices shown]
[im 1/15]
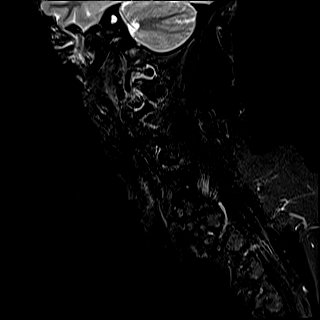
[im 3/15]
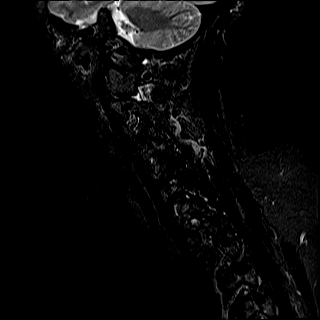
[im 5/15]
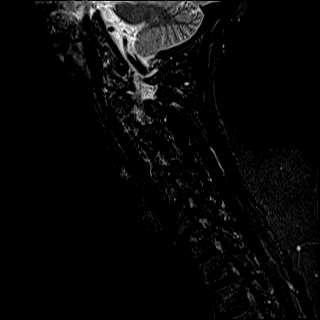
[im 8/15]
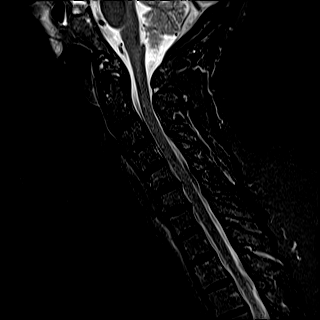
[im 10/15]
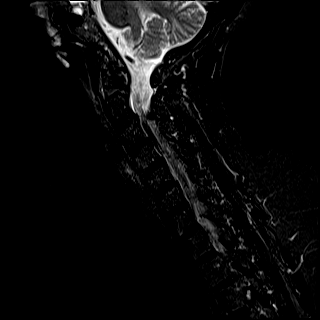
[im 12/15]
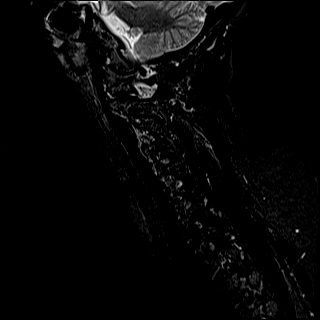
[im 15/15]
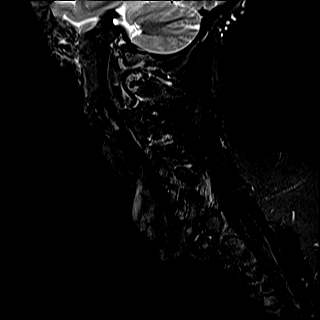

[Series 8: T2 · axial · 3.0mm · 0.70mm/px · z∈[-116,-24]mm · 10 of 29 slices shown (2 of 2)]
[im 1/29]
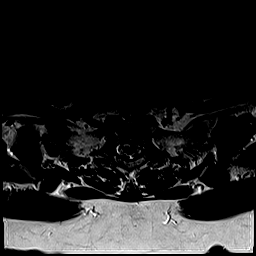
[im 3/29]
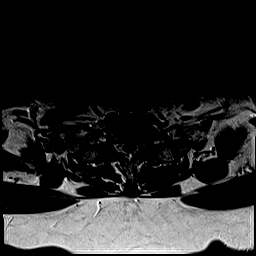
[im 5/29]
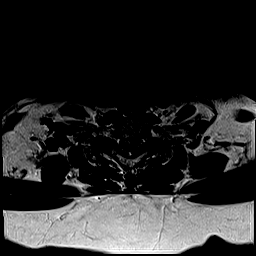
[im 7/29]
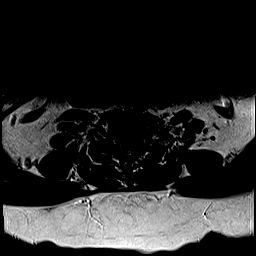
[im 9/29]
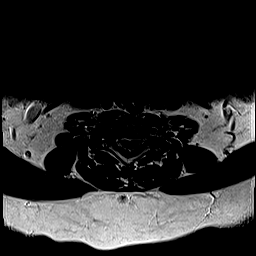
[im 13/29]
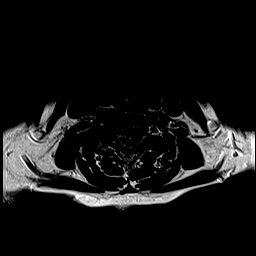
[im 16/29]
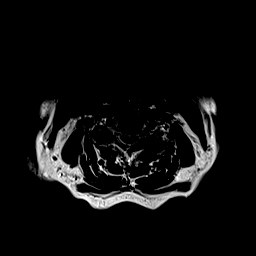
[im 20/29]
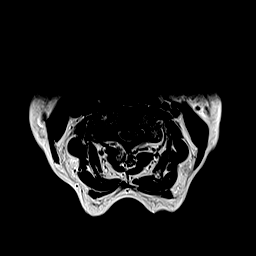
[im 24/29]
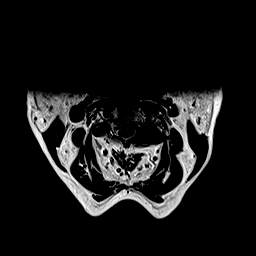
[im 29/29]
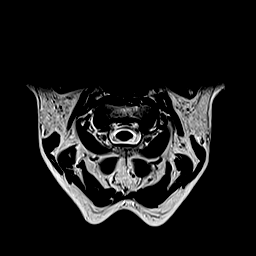

[Series 9: ax mpgr · axial · 3.0mm · 0.35mm/px · z∈[-116,-24]mm · 8 of 29 slices shown]
[im 1/29]
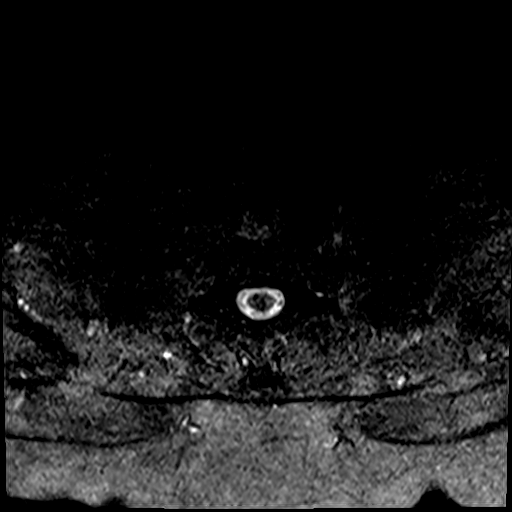
[im 5/29]
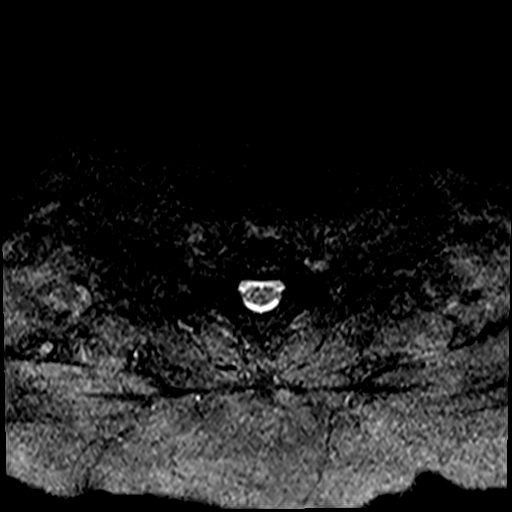
[im 9/29]
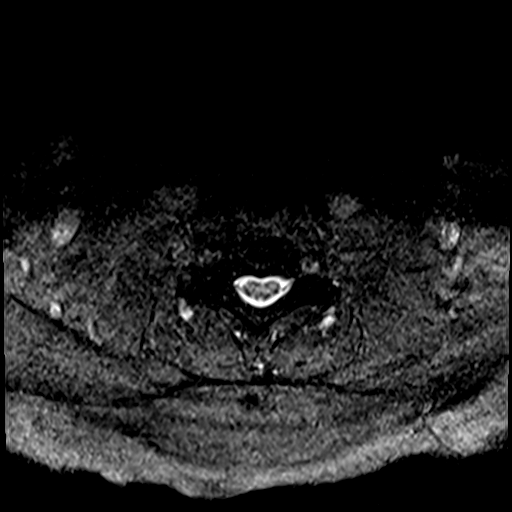
[im 13/29]
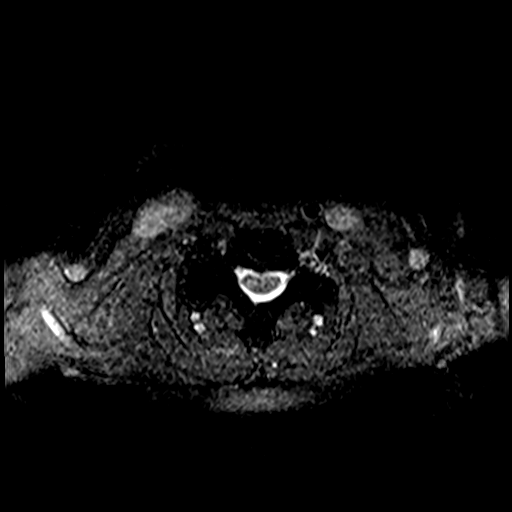
[im 16/29]
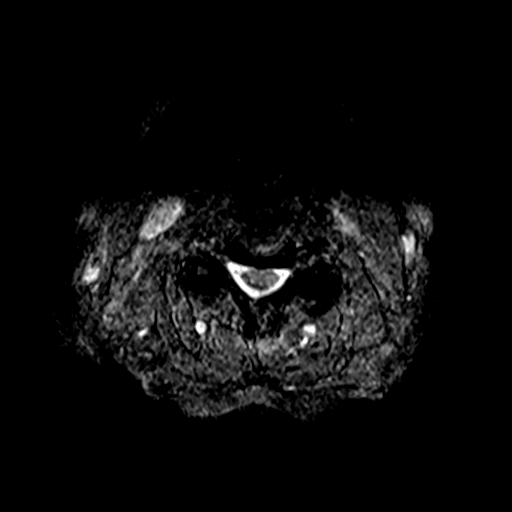
[im 20/29]
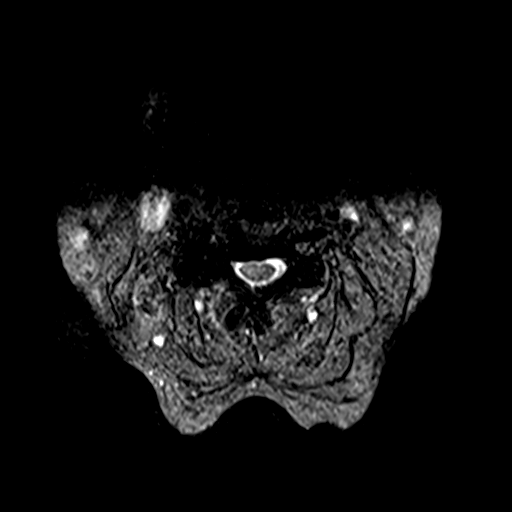
[im 24/29]
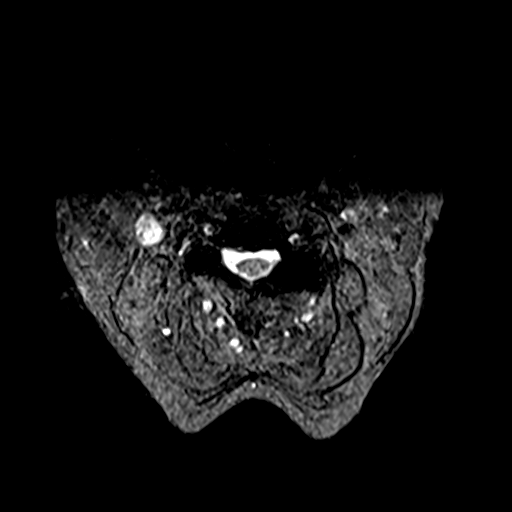
[im 29/29]
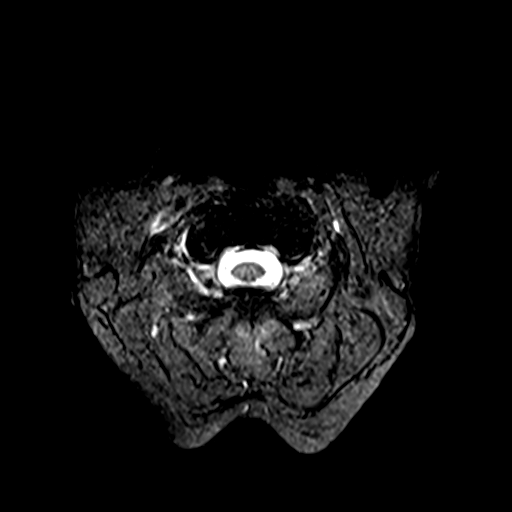

[38 of 48 positions shown; findings below may reference images not displayed]

FINDINGS: Alignment: Straightening of the normal cervical lordosis. Trace
anterolisthesis C5 on C6.

Vertebrae: No fracture, evidence of discitis, or bone lesion.

Cord: Normal signal and morphology.

Posterior Fossa, vertebral arteries, paraspinal tissues: Negative.

Disc levels:

C2-C3: Small central disc protrusion. Moderate to severe left and
mild right facet arthropathy. No spinal canal stenosis. Severe left
neural foraminal narrowing.

C3-C4: Broad-based disc bulge. Severe right and mild left facet and
uncovertebral hypertrophy. Ligamentum flavum hypertrophy. Mild
spinal canal stenosis. Severe right and mild left neural foraminal
narrowing.

C4-C5: No significant disc bulge. Severe left and mild right facet
arthropathy. Mild left neural foraminal narrowing. No spinal canal
stenosis.

C5-C6: Trace anterolisthesis with disc unroofing. Severe right and
mild left facet arthropathy. Severe right and mild left neural
foraminal narrowing. No spinal canal stenosis.

C6-C7: Mild disc bulge. Moderate right and mild left facet
arthropathy and uncovertebral hypertrophy. Mild spinal canal
stenosis. Moderate right neural foraminal narrowing.

C7-T1: No significant disc bulge. Mild facet arthropathy. No spinal
canal stenosis. Mild bilateral neural foraminal narrowing.
IMPRESSION: 1. C3-C4 mild spinal canal stenosis, severe right neural foraminal
narrowing, and mild left neural foraminal narrowing.
2. C6-C7 mild spinal canal stenosis and moderate right neural
foraminal narrowing.
3. Multilevel facet arthropathy, which, in addition to C3-C4, causes
severe neural foraminal narrowing on the left at C2-C3 and on the
right C5-C6.

## 2022-11-11 ENCOUNTER — Emergency Department: Payer: Medicare Other

## 2022-11-11 ENCOUNTER — Observation Stay
Admission: EM | Admit: 2022-11-11 | Discharge: 2022-11-12 | Disposition: A | Discharge status: Home / Self Care | Payer: Medicare Other | Attending: Internal Medicine | Admitting: Internal Medicine

## 2022-11-11 DIAGNOSIS — Z79899 Other long term (current) drug therapy: Secondary | ICD-10-CM | POA: Insufficient documentation

## 2022-11-11 DIAGNOSIS — T50905A Adverse effect of unspecified drugs, medicaments and biological substances, initial encounter: Principal | ICD-10-CM | POA: Diagnosis present

## 2022-11-11 DIAGNOSIS — Z91040 Latex allergy status: Secondary | ICD-10-CM | POA: Diagnosis not present

## 2022-11-11 DIAGNOSIS — N179 Acute kidney failure, unspecified: Secondary | ICD-10-CM

## 2022-11-11 DIAGNOSIS — E039 Hypothyroidism, unspecified: Secondary | ICD-10-CM

## 2022-11-11 DIAGNOSIS — T7840XA Allergy, unspecified, initial encounter: Secondary | ICD-10-CM | POA: Diagnosis not present

## 2022-11-11 DIAGNOSIS — E871 Hypo-osmolality and hyponatremia: Secondary | ICD-10-CM

## 2022-11-11 DIAGNOSIS — R7401 Elevation of levels of liver transaminase levels: Secondary | ICD-10-CM

## 2022-11-11 DIAGNOSIS — I110 Hypertensive heart disease with heart failure: Secondary | ICD-10-CM | POA: Insufficient documentation

## 2022-11-11 DIAGNOSIS — E785 Hyperlipidemia, unspecified: Secondary | ICD-10-CM | POA: Insufficient documentation

## 2022-11-11 DIAGNOSIS — Z888 Allergy status to other drugs, medicaments and biological substances status: Secondary | ICD-10-CM | POA: Diagnosis not present

## 2022-11-11 DIAGNOSIS — Z87891 Personal history of nicotine dependence: Secondary | ICD-10-CM | POA: Insufficient documentation

## 2022-11-11 DIAGNOSIS — I10 Essential (primary) hypertension: Secondary | ICD-10-CM | POA: Insufficient documentation

## 2022-11-11 DIAGNOSIS — L2989 Other pruritus: Principal | ICD-10-CM | POA: Insufficient documentation

## 2022-11-11 DIAGNOSIS — I2510 Atherosclerotic heart disease of native coronary artery without angina pectoris: Secondary | ICD-10-CM | POA: Diagnosis not present

## 2022-11-11 DIAGNOSIS — T368X5A Adverse effect of other systemic antibiotics, initial encounter: Secondary | ICD-10-CM | POA: Insufficient documentation

## 2022-11-11 DIAGNOSIS — Z7901 Long term (current) use of anticoagulants: Secondary | ICD-10-CM | POA: Insufficient documentation

## 2022-11-11 DIAGNOSIS — I5030 Unspecified diastolic (congestive) heart failure: Secondary | ICD-10-CM | POA: Insufficient documentation

## 2022-11-11 DIAGNOSIS — R6510 Systemic inflammatory response syndrome (SIRS) of non-infectious origin without acute organ dysfunction: Secondary | ICD-10-CM | POA: Diagnosis not present

## 2022-11-11 LAB — URINALYSIS, ROUTINE W REFLEX MICROSCOPIC
Bilirubin Urine: NEGATIVE
Glucose, UA: NEGATIVE mg/dL
Hgb urine dipstick: NEGATIVE
Ketones, ur: 5 mg/dL — AB
Leukocytes,Ua: NEGATIVE
Nitrite: NEGATIVE
Protein, ur: 30 mg/dL — AB
Specific Gravity, Urine: 1.009 (ref 1.005–1.030)
pH: 5 (ref 5.0–8.0)

## 2022-11-11 LAB — ACETAMINOPHEN LEVEL: Acetaminophen (Tylenol), Serum: <10 ug/mL — ABNORMAL LOW (ref 10–30)

## 2022-11-11 LAB — CBC WITH DIFFERENTIAL/PLATELET
Abs Immature Granulocytes: 0.12 10*3/uL — ABNORMAL HIGH (ref 0.00–0.07)
Basophils Absolute: 0 10*3/uL (ref 0.0–0.1)
Basophils Relative: 0 %
Eosinophils Absolute: 0.2 10*3/uL (ref 0.0–0.5)
Eosinophils Relative: 2 %
HCT: 39 % (ref 36.0–46.0)
Hemoglobin: 14.1 g/dL (ref 12.0–15.0)
Immature Granulocytes: 1 %
Lymphocytes Relative: 5 %
Lymphs Abs: 0.5 10*3/uL — ABNORMAL LOW (ref 0.7–4.0)
MCH: 31.6 pg (ref 26.0–34.0)
MCHC: 36.2 g/dL — ABNORMAL HIGH (ref 30.0–36.0)
MCV: 87.4 fL (ref 80.0–100.0)
Monocytes Absolute: 0.3 10*3/uL (ref 0.1–1.0)
Monocytes Relative: 2 %
Neutro Abs: 10.2 10*3/uL — ABNORMAL HIGH (ref 1.7–7.7)
Neutrophils Relative %: 90 %
Platelets: 121 10*3/uL — ABNORMAL LOW (ref 150–400)
RBC: 4.46 MIL/uL (ref 3.87–5.11)
RDW: 12.3 % (ref 11.5–15.5)
WBC: 11.4 10*3/uL — ABNORMAL HIGH (ref 4.0–10.5)
nRBC: 0 % (ref 0.0–0.2)

## 2022-11-11 LAB — COMPREHENSIVE METABOLIC PANEL
ALT: 124 U/L — ABNORMAL HIGH (ref 0–44)
AST: 95 U/L — ABNORMAL HIGH (ref 15–41)
Albumin: 3.7 g/dL (ref 3.5–5.0)
Alkaline Phosphatase: 239 U/L — ABNORMAL HIGH (ref 38–126)
Anion gap: 10 (ref 5–15)
BUN: 30 mg/dL — ABNORMAL HIGH (ref 8–23)
CO2: 21 mmol/L — ABNORMAL LOW (ref 22–32)
Calcium: 8.6 mg/dL — ABNORMAL LOW (ref 8.9–10.3)
Chloride: 90 mmol/L — ABNORMAL LOW (ref 98–111)
Creatinine, Ser: 1.84 mg/dL — ABNORMAL HIGH (ref 0.44–1.00)
GFR, Estimated: 27 mL/min — ABNORMAL LOW (ref >60)
Glucose, Bld: 123 mg/dL — ABNORMAL HIGH (ref 70–99)
Potassium: 4.3 mmol/L (ref 3.5–5.1)
Sodium: 121 mmol/L — ABNORMAL LOW (ref 135–145)
Total Bilirubin: 3.3 mg/dL — ABNORMAL HIGH (ref <1.2)
Total Protein: 7.1 g/dL (ref 6.5–8.1)

## 2022-11-11 LAB — BASIC METABOLIC PANEL
Anion gap: 8 (ref 5–15)
BUN: 25 mg/dL — ABNORMAL HIGH (ref 8–23)
CO2: 22 mmol/L (ref 22–32)
Calcium: 8.8 mg/dL — ABNORMAL LOW (ref 8.9–10.3)
Chloride: 99 mmol/L (ref 98–111)
Creatinine, Ser: 1.35 mg/dL — ABNORMAL HIGH (ref 0.44–1.00)
GFR, Estimated: 40 mL/min — ABNORMAL LOW (ref >60)
Glucose, Bld: 161 mg/dL — ABNORMAL HIGH (ref 70–99)
Potassium: 4.5 mmol/L (ref 3.5–5.1)
Sodium: 129 mmol/L — ABNORMAL LOW (ref 135–145)

## 2022-11-11 LAB — HEPATITIS PANEL, ACUTE
HCV Ab: NONREACTIVE
Hep A IgM: NONREACTIVE
Hep B C IgM: NONREACTIVE
Hepatitis B Surface Ag: NONREACTIVE

## 2022-11-11 LAB — SODIUM
Sodium: 122 mmol/L — ABNORMAL LOW (ref 135–145)
Sodium: 125 mmol/L — ABNORMAL LOW (ref 135–145)
Sodium: 126 mmol/L — ABNORMAL LOW (ref 135–145)

## 2022-11-11 LAB — PROCALCITONIN: Procalcitonin: 1.01 ng/mL

## 2022-11-11 LAB — LIPASE, BLOOD: Lipase: 19 U/L (ref 11–51)

## 2022-11-11 LAB — BILIRUBIN, FRACTIONATED(TOT/DIR/INDIR)
Bilirubin, Direct: 1.2 mg/dL — ABNORMAL HIGH (ref 0.0–0.2)
Indirect Bilirubin: 0.4 mg/dL (ref 0.3–0.9)
Total Bilirubin: 1.6 mg/dL — ABNORMAL HIGH (ref <1.2)

## 2022-11-11 LAB — LACTIC ACID, PLASMA
Lactic Acid, Venous: 2.9 mmol/L (ref 0.5–1.9)
Lactic Acid, Venous: 1.3 mmol/L (ref 0.5–1.9)

## 2022-11-11 LAB — CREATININE, URINE, RANDOM: Creatinine, Urine: 65 mg/dL

## 2022-11-11 LAB — SODIUM, URINE, RANDOM: Sodium, Ur: 32 mmol/L

## 2022-11-11 LAB — OSMOLALITY, URINE: Osmolality, Ur: 278 mosm/kg — ABNORMAL LOW (ref 300–900)

## 2022-11-11 LAB — OSMOLALITY: Osmolality: 269 mosm/kg — ABNORMAL LOW (ref 275–295)

## 2022-11-11 MED ORDER — ALUM & MAG HYDROXIDE-SIMETH 200-200-20 MG/5ML PO SUSP
30.0000 mL | ORAL | Status: DC | PRN
  Administered 2022-11-11: 30 mL via ORAL
  Filled 2022-11-11: qty 30

## 2022-11-11 MED ORDER — ONDANSETRON HCL 4 MG PO TABS: 4.0000 mg | ORAL_TABLET | Freq: Four times a day (QID) | ORAL | Status: DC | PRN

## 2022-11-11 MED ORDER — DIPHENHYDRAMINE HCL 50 MG/ML IJ SOLN: 25.0000 mg | Freq: Four times a day (QID) | INTRAMUSCULAR | Status: DC | PRN

## 2022-11-11 MED ORDER — DEXAMETHASONE SODIUM PHOSPHATE 10 MG/ML IJ SOLN
6.0000 mg | Freq: Once | INTRAMUSCULAR | Status: AC
  Administered 2022-11-11: 6 mg via INTRAVENOUS
  Filled 2022-11-11: qty 1

## 2022-11-11 MED ORDER — ONDANSETRON HCL 4 MG/2ML IJ SOLN: 4.0000 mg | Freq: Four times a day (QID) | INTRAMUSCULAR | Status: DC | PRN

## 2022-11-11 MED ORDER — HYDROXYZINE HCL 25 MG PO TABS
25.0000 mg | ORAL_TABLET | ORAL | Status: DC | PRN
  Administered 2022-11-11 – 2022-11-12 (×3): 25 mg via ORAL
  Filled 2022-11-11 (×3): qty 1

## 2022-11-11 MED ORDER — ENOXAPARIN SODIUM 40 MG/0.4ML IJ SOSY
40.0000 mg | PREFILLED_SYRINGE | INTRAMUSCULAR | Status: DC
  Administered 2022-11-11: 40 mg via SUBCUTANEOUS
  Filled 2022-11-11 (×2): qty 0.4

## 2022-11-11 MED ORDER — SODIUM CHLORIDE 0.9 % IV SOLN: INTRAVENOUS | Status: DC

## 2022-11-11 MED ORDER — EPINEPHRINE 0.3 MG/0.3ML IJ SOAJ
0.3000 mg | Freq: Once | INTRAMUSCULAR | Status: AC
Start: 2022-11-11 — End: 2022-11-11
  Administered 2022-11-11: 0.3 mg via INTRAMUSCULAR
  Filled 2022-11-11: qty 0.3

## 2022-11-11 MED ORDER — DEXAMETHASONE 6 MG PO TABS
6.0000 mg | ORAL_TABLET | Freq: Every day | ORAL | Status: DC
  Administered 2022-11-12: 6 mg via ORAL
  Filled 2022-11-11: qty 1

## 2022-11-11 MED ORDER — LACTATED RINGERS IV BOLUS
1000.0000 mL | Freq: Once | INTRAVENOUS | Status: AC
  Administered 2022-11-11: 1000 mL via INTRAVENOUS

## 2022-11-11 MED ORDER — EPINEPHRINE PF 1 MG/ML IJ SOLN
INTRAMUSCULAR | Status: AC
  Filled 2022-11-11: qty 1

## 2022-11-11 NOTE — Assessment & Plan Note (Signed)
Can go back on blood pressure medication

## 2022-11-11 NOTE — Assessment & Plan Note (Signed)
Creatinine 1.84 upon admission at 1.0 upon discharge.  Likely secondary to Bactrim and not eating well.

## 2022-11-11 NOTE — ED Notes (Signed)
Lavender, light green, and red top sent to lab for any lab orders for blood

## 2022-11-11 NOTE — H&P (Addendum)
History and Physical    Patient: Nichole Sanchez OZD:[id number] DOB: Mar 03, 1940 DOA: 11/11/2022 DOS: the patient was seen and examined on 11/11/2022 PCP: Barbette Reichmann, MD  Patient coming from: Home  Chief Complaint:  Chief Complaint  Patient presents with   Allergic Reaction   HPI: Nichole Sanchez is a 82 y.o. female with medical history significant of coronary artery disease, HFpEF, GERD, hypertension, hyperlipidemia, hypothyroidism presenting with drug reaction, AKI, hyponatremia, transaminitis, hyperbilirubinemia.  Patient reports having a trigger finger release done around October 31.  Was placed on course of Bactrim prophylactically for 10 days.  Patient reports developing generalized malaise within 2 to 3 days of taking medication.  Had fatigue, decreased p.o. intake.  Over the course of time, patient developed severe headaches, nausea.  Positive malaise and bodyaches.  Had fevers at home to around 102 roughly 3 to 4 days ago.  Was given some Tylenol and ibuprofen to help with symptoms.  Patient does report prior reaction in the past including rash associated with propylene glycol and tussin.  No reported other medications given.  Has had stable dosing of medications including Norvasc, Crestor, Synthroid, candesartan.  Mild oral pain.  No focal hemiparesis or confusion. Presented to the ER afebrile, hemodynamically stable.  Satting well on room air.  Labs notable for white count 11.4, hemoglobin 14, platelets 121, creatinine 1.84, AST 95, ALT 124, T. bili 3.3, lipase within normal limits.  Procalcitonin 1.01.  Lactate 2.9.  CT abdomen pelvis grossly within normal limits.  Ultrasound of the abdomen with hepatic steatosis. Review of Systems: As mentioned in the history of present illness. All other systems reviewed and are negative. Past Medical History:  Diagnosis Date   Adenomatous polyps    Anemia    Aortic atherosclerosis (HCC)    Arthritis    Basal cell carcinoma (BCC) of skin  of face    Bronchitis    Carpal tunnel syndrome    Coronary artery disease    a.) LHC 03/19/2017 --> 99% mRCA, 99% dRCA; 2.50 x 18 mm Xience Sierra DES x 1 to [facility]; distal lesion treated medically.   Diastolic dysfunction 02/23/2017   a.) TTE 02/23/2017: EF >55%, mild LVH, mild MR/TR, G1DD; b.) TTE 06/19/2019: EF >55%, mild LAE, triv AR/TR/PR, mild MR, G1DD.   Diverticulosis    Dyspnea    Full dentures    GERD (gastroesophageal reflux disease)    Heart murmur    Hemorrhoids    a.) s/p banding in 2011   Hyperlipidemia    Hypertension    Hypothyroidism    Lumbar stenosis with neurogenic claudication    Migraines    Mild intermittent asthma    a.) well controlled; no MDIs   Pneumonia    Past Surgical History:  Procedure Laterality Date   APPENDECTOMY N/A    CARPAL TUNNEL RELEASE Right 06/25/2016   Procedure: CARPAL TUNNEL RELEASE;  Surgeon: Deeann Saint, MD;  Location: [facility];  Service: Orthopedics;  Laterality: Right;   CATARACT EXTRACTION W/PHACO Right 08/02/2014   Procedure: CATARACT EXTRACTION PHACO AND INTRAOCULAR LENS PLACEMENT (IOC);  Surgeon: Sallee Lange, MD;  Location: [facility];  Service: Ophthalmology;  Laterality: Right;  [place]:  01:30.3 AP%: 16.4 CDE: 28.22  Fluid Lot# [id number]   CATARACT EXTRACTION W/PHACO Left 08/23/2014   Procedure: CATARACT EXTRACTION PHACO AND INTRAOCULAR LENS PLACEMENT (IOC);  Surgeon: Sallee Lange, MD;  Location: [facility];  Service: Ophthalmology;  Laterality: Left;  [place] 01:11.4 AP% 23.0 CDE 28.17 FLUID LOT# [id number]  CHOLECYSTECTOMY N/A 2011   COLONOSCOPY N/A 02/23/2009   COLONOSCOPY WITH PROPOFOL N/A 09/19/2016   Procedure: COLONOSCOPY WITH PROPOFOL;  Surgeon: Scot Jun, MD;  Location: [facility];  Service: Endoscopy;  Laterality: N/A;   CORONARY STENT INTERVENTION N/A 03/12/2017   Procedure: CORONARY STENT INTERVENTION;  Surgeon: Marcina Millard, MD;  Location: [facility];  Service: Cardiovascular;   Laterality: N/A;   DILATION AND CURETTAGE OF UTERUS     ESOPHAGOGASTRODUODENOSCOPY N/A 02/23/2009   ESOPHAGOGASTRODUODENOSCOPY N/A 03/31/2012   ESOPHAGOGASTRODUODENOSCOPY N/A 11/24/1999   ESOPHAGOGASTRODUODENOSCOPY (EGD) WITH PROPOFOL N/A 07/28/2019   Procedure: ESOPHAGOGASTRODUODENOSCOPY (EGD) WITH PROPOFOL;  Surgeon: Regis Bill, MD;  Location: [facility];  Service: Endoscopy;  Laterality: N/A;   EYE SURGERY Bilateral 06/2016   LASER SURGERY "FOR CLOUDY EYES"   HEMORRHOID BANDING N/A 2011   KNEE ARTHROSCOPY Left 2014   LEFT HEART CATH AND CORONARY ANGIOGRAPHY Left 03/12/2017   Procedure: LEFT HEART CATH AND CORONARY ANGIOGRAPHY;  Surgeon: Dalia Heading, MD;  Location: [facility];  Service: Cardiovascular;  Laterality: Left;   LUMBAR LAMINECTOMY/DECOMPRESSION MICRODISCECTOMY N/A 09/28/2020   Procedure: L4-5 DECOMPRESSION;  Surgeon: Venetia Night, MD;  Location: [facility];  Service: Neurosurgery;  Laterality: N/A;   TONSILLECTOMY N/A 1963   TOTAL KNEE ARTHROPLASTY Right    TOTAL KNEE ARTHROPLASTY Left 09/28/2021   Procedure: TOTAL KNEE ARTHROPLASTY;  Surgeon: Reinaldo Berber, MD;  Location: [facility];  Service: Orthopedics;  Laterality: Left;   TRIGGER FINGER RELEASE Left    VAGINAL HYSTERECTOMY N/A 1986   WRIST JOINT AND TENDON TRANSPLANT     late 1990s   Social History:  reports that she quit smoking about 48 years ago. Her smoking use included cigarettes. She has never used smokeless tobacco. She reports that she does not drink alcohol and does not use drugs.  Allergies  Allergen Reactions   Propylene Glycol Rash and Other (See Comments)    blisters   Tricor [Fenofibrate] Shortness Of Breath    Increases blood pressure   Tussin Cough [Dextromethorphan Hbr] Rash and Other (See Comments)    Propylene glychol Increases blood pressure   Advair Diskus [Fluticasone-Salmeterol] Other (See Comments)    blisters   Albuterol Sulfate Swelling   Benzalkonium  Chloride Other (See Comments)    Unknown   Cyclobenzaprine Other (See Comments)    Unknown   Hctz [Hydrochlorothiazide] Other (See Comments)    blisters   Neosporin [Neomycin-Bacitracin Zn-Polymyx] Other (See Comments)    blisters   Nickel Other (See Comments)    Itching, blister and raw rash   Prednisone Other (See Comments)    Can not take in large doses   Proair Hfa [Albuterol] Swelling   Codeine Nausea And Vomiting and Rash   Esomeprazole Rash and Other (See Comments)    Writing on pill has propylene glychol   Etodolac Palpitations   Latex Rash   Naproxen Palpitations   Nystatin Rash   Oxycodone-Acetaminophen Rash    Patient reports that she can take tylenol   Polysporin [Bacitracin-Polymyxin B] Rash   Tape Rash    Dermabond- Blisters   Tussionex Pennkinetic Er [Hydrocod Poli-Chlorphe Poli Er] Rash    History reviewed. No pertinent family history.  Prior to Admission medications   Medication Sig Start Date End Date Taking? Authorizing Provider  acetaminophen (TYLENOL) 500 MG tablet Take 1,000 mg by mouth every 6 (six) hours as needed for moderate pain or headache.    [provider]  alum hydroxide-mag trisilicate (  GAVISCON) 80-20 MG CHEW chewable tablet Chew 1 tablet by mouth as needed for indigestion or heartburn.    [provider]  amLODipine (NORVASC) 2.5 MG tablet Take 2.5 mg by mouth at bedtime.     [provider]  aspirin EC 81 MG tablet Take 81 mg by mouth daily. Swallow whole.    [provider]  AZO-CRANBERRY PO Take 2 capsules by mouth daily.    [provider]  B Complex-C (SUPER B COMPLEX PO) Take 1 tablet by mouth daily with lunch.    [provider]  BIOTIN PO Take 1 capsule by mouth daily.    [provider]  candesartan (ATACAND) 32 MG tablet Take 32 mg by mouth daily.    [provider]  Cholecalciferol (VITAMIN D) 2000 units CAPS Take 2,000 Units by mouth daily.     [provider]  levothyroxine (SYNTHROID) 88 MCG tablet Take 88 mcg by mouth daily before breakfast.    [provider]  loratadine (CLARITIN) 10 MG tablet Take 10 mg by mouth in the morning.    [provider]  meclizine (ANTIVERT) 25 MG tablet Take 25 mg by mouth 3 (three) times daily as needed for dizziness.    [provider]  meloxicam (MOBIC) 15 MG tablet Take 1/2 a tablet by mouth daily    [provider]  methocarbamol (ROBAXIN) 500 MG tablet Take 1,000 mg by mouth daily as needed (Back pain). 08/11/20   [provider]  Omega-3 Fatty Acids (FISH OIL) 1000 MG CAPS Take 1,000 mg by mouth 2 (two) times daily.     [provider]  rosuvastatin (CRESTOR) 5 MG tablet Take 5 mg by mouth daily.    [provider]  vitamin E 180 MG (400 UNITS) capsule Take 400 Units by mouth daily.    [provider]    Physical Exam: Vitals:   11/11/22 0415 11/11/22 0425 11/11/22 0500 11/11/22 0740  BP: (!) 138/59  (!) 119/98 131/83  Pulse: 94  (!) 102 87  Resp: 13  18 (!) 21  Temp:  98.1 F (36.7 C)  98.3 F (36.8 C)  TempSrc:  Oral  Oral  SpO2: 97%  96% 96%  Weight:      Height:       Physical Exam Constitutional:      Appearance: She is normal weight.  HENT:     Head: Normocephalic and atraumatic.     Nose: Nose normal.     Mouth/Throat:     Mouth: Mucous membranes are dry.  Eyes:     Pupils: Pupils are equal, round, and reactive to light.  Cardiovascular:     Rate and Rhythm: Normal rate and regular rhythm.  Pulmonary:     Effort: Pulmonary effort is normal.  Abdominal:     General: Bowel sounds are normal.  Musculoskeletal:        General: Normal range of motion.     Cervical back: Normal range of motion.  Skin:    General: Skin is dry.     Findings: Rash present.  Neurological:     General: No focal deficit present.           Data Reviewed:  There are no new results to review at this time.  US  ABDOMEN LIMITED RUQ (LIVER/GB) CLINICAL DATA:  Transaminitis.  EXAM: ULTRASOUND ABDOMEN LIMITED RIGHT UPPER QUADRANT  COMPARISON:  CT scan performed earlier same day.  FINDINGS: Gallbladder:  Surgically  absent  Common bile duct:  Diameter: 4 mm  Liver:  Diffuse coarsening of hepatic echotexture with heterogeneous appearance. No discrete or focal mass lesion evident. Portal vein is patent on color Doppler imaging with normal direction of blood flow towards the liver.  Other: None.  IMPRESSION: 1. Diffuse coarsening of hepatic echotexture with heterogeneous appearance. Findings compatible with hepatic steatosis. 2. No biliary dilatation. 3. Status post cholecystectomy.  Electronically Signed   By: Kennith Center M.D.   On: 11/11/2022 06:31 CT ABDOMEN PELVIS WO CONTRAST CLINICAL DATA:  Abdominal bloating with nausea vomiting and diarrhea. Transaminitis.  EXAM: CT ABDOMEN AND PELVIS WITHOUT CONTRAST  TECHNIQUE: Multidetector CT imaging of the abdomen and pelvis was performed following the standard protocol without IV contrast.  RADIATION DOSE REDUCTION: This exam was performed according to the departmental dose-optimization program which includes automated exposure control, adjustment of the mA and/or kV according to patient size and/or use of iterative reconstruction technique.  COMPARISON:  05/26/2019  FINDINGS: Lower chest: Atelectasis noted right middle lobe and lingula. No substantial pleural effusion.  Hepatobiliary: No suspicious focal abnormality in the liver on this study without intravenous contrast. Cholecystectomy. No intrahepatic or extrahepatic biliary dilation.  Pancreas: No focal mass lesion. No dilatation of the main duct. No intraparenchymal cyst. No peripancreatic edema.  Spleen: No splenomegaly. No suspicious focal mass lesion.  Adrenals/Urinary Tract: No adrenal nodule or mass. Central sinus cysts in both kidneys better demonstrated  on previous exam performed with intravenous contrast material. Kidneys otherwise unremarkable. No evidence for hydroureter. The urinary bladder appears normal for the degree of distention.  Stomach/Bowel: Stomach is unremarkable. No gastric wall thickening. No evidence of outlet obstruction. Duodenum is normally positioned as is the ligament of Treitz. No small bowel wall thickening. No small bowel dilatation. The terminal ileum is normal. The appendix is not well visualized, but there is no edema or inflammation in the region of the cecal tip to suggest appendicitis. No gross colonic mass. No colonic wall thickening. Diverticuli are seen scattered along the entire length of the colon, most prominent in the sigmoid segment without CT findings of diverticulitis.  Vascular/Lymphatic: There is moderate atherosclerotic calcification of the abdominal aorta without aneurysm. Upper normal lymph nodes are seen in the hepatoduodenal ligament. No retroperitoneal lymphadenopathy. No pelvic sidewall lymphadenopathy.  Reproductive: Hysterectomy.  There is no adnexal mass.  Other: No intraperitoneal free fluid.  Musculoskeletal: No worrisome lytic or sclerotic osseous abnormality. Tiny supraumbilical midline ventral hernia contains only fat.  IMPRESSION: 1. No acute findings in the abdomen or pelvis. Specifically, no findings to explain the patient's history of abdominal bloating, nausea, vomiting, and diarrhea. 2. Colonic diverticulosis without diverticulitis. 3.  Aortic Atherosclerosis (ICD10-I70.0).  Electronically Signed   By: Kennith Center M.D.   On: 11/11/2022 06:25  Lab Results  Component Value Date   WBC 11.4 (H) 11/11/2022   HGB 14.1 11/11/2022   HCT 39.0 11/11/2022   MCV 87.4 11/11/2022   PLT 121 (L) 11/11/2022   Last metabolic panel Lab Results  Component Value Date   GLUCOSE 123 (H) 11/11/2022   NA 121 (L) 11/11/2022   K 4.3 11/11/2022   CL 90 (L) 11/11/2022   CO2  21 (L) 11/11/2022   BUN 30 (H) 11/11/2022   CREATININE 1.84 (H) 11/11/2022   GFRNONAA 27 (L) 11/11/2022   CALCIUM 8.6 (L) 11/11/2022   PROT 7.1 11/11/2022   ALBUMIN 3.7 11/11/2022   BILITOT 3.3 (H) 11/11/2022   ALKPHOS 239 (H) 11/11/2022  AST 95 (H) 11/11/2022   ALT 124 (H) 11/11/2022   ANIONGAP 10 11/11/2022    Assessment and Plan: * Drug reaction Patient reporting generalized malaise, new onset central rash, body aches itching headache, nausea and decreased p.o. intake over the past 5 to 6 days Noted to have been recently started on Bactrim status post trigger finger release around November 1 Noted organ involvement with transaminitis with AST 95, ALT 124, T bili 3.3, and AKI with creatinine 1.8 Overall symptomatology most consistent with Bactrim related drug reaction versus dress syndrome No oral involvement concerning for Trudie Buckler syndrome  S/p IV decadron in the ER  Continue systemic steroids and antihistamines    Transaminitis AST 95, ALT 124, T bili 3.3 on presentation  CT abdomen pelvis as well as right upper quadrant ultrasound negative for any biliary disease though with noted hepatic steatosis Suspect component of drug reaction to overall presentation Will trend labs with treatment IV fluid hydration Add on hepatitis panel and Tylenol level Consider GI consult if labs continue to significantly uptrend  Hyponatremia Sodium 121 on presentation Hypovolemic hyponatremia as patient is markedly dry in the setting of drug reaction Will place on IV fluid hydration Serial sodiums Goal 6-8 mill equivalent sodium change over the next 12 to 24 hours   AKI (acute kidney injury) (HCC) Creatinine 1.6 on presentation Suspect multifactorial with contributions of dehydration, possible drug reaction as well as ARB use BUN/creatinine ratio around 20:1 IV fluid hydration Hold offending agents Check FENa Follow-up   SIRS (systemic inflammatory response syndrome)  (HCC) Meeting SIRS criteria with heart rate 100s, respirations in the mid 20s Noted concurrent drug reaction in the setting of Bactrim use White count 11.4 Lactate 2.9-->1.3 No overt source of infection at present Procalcitonin 1.01 Status post epinephrine, Decadron in the ER Pancultured Case discussed w/ on call ID Dr. Drue Second Will defer antibiotics for now Critical care consultation as clinically indicated.  HTN (hypertension) BP stable  Hold norvasc and ARB for now in setting of drug reaction  Follow closely   CAD (coronary artery disease) Baseline CAD  No active chest pain  Continue home regimen including aspirin and statin       Advance Care Planning:   Code Status: Full Code   Consults: None   Family Communication: Husband at the bedside   Severity of Illness: The appropriate patient status for this patient is INPATIENT. Inpatient status is judged to be reasonable and necessary in order to provide the required intensity of service to ensure the patient's safety. The patient's presenting symptoms, physical exam findings, and initial radiographic and laboratory data in the context of their chronic comorbidities is felt to place them at high risk for further clinical deterioration. Furthermore, it is not anticipated that the patient will be medically stable for discharge from the hospital within 2 midnights of admission.   * I certify that at the point of admission it is my clinical judgment that the patient will require inpatient hospital care spanning beyond 2 midnights from the point of admission due to high intensity of service, high risk for further deterioration and high frequency of surveillance required.*  Author: Floydene Flock, MD 11/11/2022 8:35 AM  For on call review [email or web address].

## 2022-11-11 NOTE — Consult Note (Signed)
Brief critical care note   Nichole Sanchez is an 82 year old female patient with a past medical history of heart failure with preserved EF, GERD, hypertension, hyperlipidemia and hypothyroidism and recent trigger finger surgery of the right arm presenting today to [facility] with 2 days of generalized malaise and maculopapular rash covering her back and her chest.  Postsurgery she was placed on a course of Bactrim prophylactically for 10 days and for the last couple of days she started having nausea dry heaves and intermittent fevers.  On arrival she was found to be dry on exam, labs with hyponatremia at 121, chloride 90, BUN 30 and creatinine 1.84 mg/dL from a baseline of 2.44.  Lactic acid was 2.3.  CT abdomen pelvis was negative for any acute findings.  UA was done without any signs of infection.  No peripheral eosinophilia on CBC.  On exam GEN patient alert mentating well pleasant HEENT supple neck, reactive pupils, EOMI CVS normal S1, normal S2, regular rate and rhythm Lungs clear bilateral air entry Abdomen soft nontender nondistended positive bowel sounds Extremities warm well-perfused no edema Skin maculopapular rash noted on the top of her back tracking downwards as well as her chest and neck.  Labs and imaging were reviewed  Assessment and plan  Nichole Sanchez is a 82 year old female with history of recent trigger finger surgery for her right arm status post 10-day course of Bactrim presenting with nausea malaise dry heaves and a maculopapular rash on her back and chest.  Thought to be secondary to Bactrim leading to AKI, dehydration and maculopapular rash.  No other signs of infectious process. No signs of DRESS syndrome, unlikely with lack of peripheral eosinophilia.  Recommended consulting ID for help with further investigation.  Agree with rehydration and prednisone.  Monitor sodium every 4 hours goal 8 to 12 mEq in 24 hours.  Please do not hesitate to contact us back  with any questions or concerns.  Currently does not meet indication for ICU level of care.  I spent 30 minutes caring for this patient today, including preparing to see the patient, obtaining a medical history , reviewing a separately obtained history, performing a medically appropriate examination and/or evaluation, counseling and educating the patient/family/caregiver, documenting clinical information in the electronic health record, and independently interpreting results (not separately reported/billed) and communicating results to the patient/family/caregiver  Janann Colonel, MD [facility] 11/11/2022 2:01 PM

## 2022-11-11 NOTE — Assessment & Plan Note (Addendum)
No signs of infection.  Hold off on antibiotics.  Likely reaction secondary to Bactrim.

## 2022-11-11 NOTE — Assessment & Plan Note (Signed)
Continue home regimen including aspirin and statin

## 2022-11-11 NOTE — ED Notes (Signed)
ED TO INPATIENT HANDOFF REPORT  ED Nurse Name and Phone #: Osborne Casco, RN  S Name/Age/Gender Nichole Sanchez 82 y.o. female Room/Bed: ED09A/ED09A  Code Status   Code Status: Prior  Home/SNF/Other Home Patient oriented to: self, place, time, and situation Is this baseline? Yes   Triage Complete: Triage complete  Chief Complaint Drug reaction [T50.905A]  Triage Note Pt arrived POV for allergic reaction. Pt started Bactrim a few days ago (recent hand sx).  Pt reports yesterday had vomiting/dry heaves, last night developed a rash to chest, itching, and now PTA pt feels SOB, expiratory wheezing noted. Pt reports throat tightness, tongue feels "raw and has bumps". But pt is able to speak in complete sentences, sats 97% on RA.   Pt took benadryl prior to midnight.    Allergies Allergies  Allergen Reactions   Propylene Glycol Rash and Other (See Comments)    blisters   Tricor [Fenofibrate] Shortness Of Breath    Increases blood pressure   Tussin Cough [Dextromethorphan Hbr] Rash and Other (See Comments)    Propylene glychol Increases blood pressure   Advair Diskus [Fluticasone-Salmeterol] Other (See Comments)    blisters   Albuterol Sulfate Swelling   Benzalkonium Chloride Other (See Comments)    Unknown   Cyclobenzaprine Other (See Comments)    Unknown   Hctz [Hydrochlorothiazide] Other (See Comments)    blisters   Neosporin [Neomycin-Bacitracin Zn-Polymyx] Other (See Comments)    blisters   Nickel Other (See Comments)    Itching, blister and raw rash   Prednisone Other (See Comments)    Can not take in large doses   Proair Hfa [Albuterol] Swelling   Codeine Nausea And Vomiting and Rash   Esomeprazole Rash and Other (See Comments)    Writing on pill has propylene glychol   Etodolac Palpitations   Latex Rash   Naproxen Palpitations   Nystatin Rash   Oxycodone-Acetaminophen Rash    Patient reports that she can take tylenol   Polysporin [Bacitracin-Polymyxin B] Rash    Tape Rash    Dermabond- Blisters   Tussionex Pennkinetic Er [Hydrocod Poli-Chlorphe Poli Er] Rash    Level of Care/Admitting Diagnosis ED Disposition     ED Disposition  Admit   Condition  --   Comment  Hospital Area: [facility] [[id number]]  Level of Care: Med-Surg [16]  Covid Evaluation: Confirmed COVID Negative  Diagnosis: Drug reaction [[id number]]  Admitting Physician: Floydene Flock [[id number]]  Attending Physician: Floydene Flock [phone]  Certification:: I certify this patient will need inpatient services for at least 2 midnights  Expected Medical Readiness: 11/13/2022          B Medical/Surgery History Past Medical History:  Diagnosis Date   Adenomatous polyps    Anemia    Aortic atherosclerosis (HCC)    Arthritis    Basal cell carcinoma (BCC) of skin of face    Bronchitis    Carpal tunnel syndrome    Coronary artery disease    a.) LHC 03/19/2017 --> 99% mRCA, 99% dRCA; 2.50 x 18 mm Xience Sierra DES x 1 to [facility]; distal lesion treated medically.   Diastolic dysfunction 02/23/2017   a.) TTE 02/23/2017: EF >55%, mild LVH, mild MR/TR, G1DD; b.) TTE 06/19/2019: EF >55%, mild LAE, triv AR/TR/PR, mild MR, G1DD.   Diverticulosis    Dyspnea    Full dentures    GERD (gastroesophageal reflux disease)    Heart murmur    Hemorrhoids    a.) s/p banding  in 2011   Hyperlipidemia    Hypertension    Hypothyroidism    Lumbar stenosis with neurogenic claudication    Migraines    Mild intermittent asthma    a.) well controlled; no MDIs   Pneumonia    Past Surgical History:  Procedure Laterality Date   APPENDECTOMY N/A    CARPAL TUNNEL RELEASE Right 06/25/2016   Procedure: CARPAL TUNNEL RELEASE;  Surgeon: Deeann Saint, MD;  Location: [facility] ORS;  Service: Orthopedics;  Laterality: Right;   CATARACT EXTRACTION W/PHACO Right 08/02/2014   Procedure: CATARACT EXTRACTION PHACO AND INTRAOCULAR LENS PLACEMENT (IOC);  Surgeon: Sallee Lange, MD;   Location: [facility];  Service: Ophthalmology;  Laterality: Right;  [place]:  01:30.3 AP%: 16.4 CDE: 28.22  Fluid Lot# [id number]   CATARACT EXTRACTION W/PHACO Left 08/23/2014   Procedure: CATARACT EXTRACTION PHACO AND INTRAOCULAR LENS PLACEMENT (IOC);  Surgeon: Sallee Lange, MD;  Location: [facility];  Service: Ophthalmology;  Laterality: Left;  [place] 01:11.4 AP% 23.0 CDE 28.17 FLUID LOT# [id number]   CHOLECYSTECTOMY N/A 2011   COLONOSCOPY N/A 02/23/2009   COLONOSCOPY WITH PROPOFOL N/A 09/19/2016   Procedure: COLONOSCOPY WITH PROPOFOL;  Surgeon: Scot Jun, MD;  Location: [facility] ENDOSCOPY;  Service: Endoscopy;  Laterality: N/A;   CORONARY STENT INTERVENTION N/A 03/12/2017   Procedure: CORONARY STENT INTERVENTION;  Surgeon: Marcina Millard, MD;  Location: [facility] INVASIVE CV LAB;  Service: Cardiovascular;  Laterality: N/A;   DILATION AND CURETTAGE OF UTERUS     ESOPHAGOGASTRODUODENOSCOPY N/A 02/23/2009   ESOPHAGOGASTRODUODENOSCOPY N/A 03/31/2012   ESOPHAGOGASTRODUODENOSCOPY N/A 11/24/1999   ESOPHAGOGASTRODUODENOSCOPY (EGD) WITH PROPOFOL N/A 07/28/2019   Procedure: ESOPHAGOGASTRODUODENOSCOPY (EGD) WITH PROPOFOL;  Surgeon: Regis Bill, MD;  Location: [facility] ENDOSCOPY;  Service: Endoscopy;  Laterality: N/A;   EYE SURGERY Bilateral 06/2016   LASER SURGERY "FOR CLOUDY EYES"   HEMORRHOID BANDING N/A 2011   KNEE ARTHROSCOPY Left 2014   LEFT HEART CATH AND CORONARY ANGIOGRAPHY Left 03/12/2017   Procedure: LEFT HEART CATH AND CORONARY ANGIOGRAPHY;  Surgeon: Dalia Heading, MD;  Location: [facility] INVASIVE CV LAB;  Service: Cardiovascular;  Laterality: Left;   LUMBAR LAMINECTOMY/DECOMPRESSION MICRODISCECTOMY N/A 09/28/2020   Procedure: L4-5 DECOMPRESSION;  Surgeon: Venetia Night, MD;  Location: [facility] ORS;  Service: Neurosurgery;  Laterality: N/A;   TONSILLECTOMY N/A 1963   TOTAL KNEE ARTHROPLASTY Right    TOTAL KNEE ARTHROPLASTY Left 09/28/2021   Procedure: TOTAL KNEE ARTHROPLASTY;  Surgeon:  Reinaldo Berber, MD;  Location: [facility] OR[facility];  Service: Orthopedics;  Laterality: Left;   TRIGGER FINGER RELEASE Left    VAGINAL HYSTERECTOMY N/A 1986   WRIST JOINT AND TENDON TRANSPLANT     late 1990s     A IV Location/Drains/Wounds Patient Lines/Drains/Airways Status     Active Line/Drains/Airways     Name Placement date Placement time Site Days   Peripheral IV 11/11/22 18 G Right Antecubital 11/11/22  0410  Antecubital  less than 1   Peripheral IV 11/11/22 20 G Left Antecubital 11/11/22  0550  Antecubital  less than 1   Incision (Closed) 08/02/14 Eye Right 08/02/14  1052  -- 3023   Incision (Closed) 08/23/14 Eye Left 08/23/14  1202  -- 3002   Incision (Closed) 06/25/16 Hand Right 06/25/16  1322  -- 2330   Incision (Closed) 09/28/20 Back Other (Comment) 09/28/20  1058  -- 774   Incision (Closed) 09/28/21 Knee 09/28/21  0917  -- 409            Intake/Output Last 24 hours No intake or  output data in the 24 hours ending 11/11/22 0745  Labs/Imaging Results for orders placed or performed during the hospital encounter of 11/11/22 (from the past 48 hour(s))  CBC with Differential/Platelet     Status: Abnormal   Collection Time: 11/11/22  4:11 AM  Result Value Ref Range   WBC 11.4 (H) 4.0 - 10.5 K/uL   RBC 4.46 3.87 - 5.11 MIL/uL   Hemoglobin 14.1 12.0 - 15.0 g/dL   HCT 09.8 11.9 - 14.7 %   MCV 87.4 80.0 - 100.0 fL   MCH 31.6 26.0 - 34.0 pg   MCHC 36.2 (H) 30.0 - 36.0 g/dL   RDW 82.9 56.2 - 13.0 %   Platelets 121 (L) 150 - 400 K/uL   nRBC 0.0 0.0 - 0.2 %   Neutrophils Relative % 90 %   Neutro Abs 10.2 (H) 1.7 - 7.7 K/uL   Lymphocytes Relative 5 %   Lymphs Abs 0.5 (L) 0.7 - 4.0 K/uL   Monocytes Relative 2 %   Monocytes Absolute 0.3 0.1 - 1.0 K/uL   Eosinophils Relative 2 %   Eosinophils Absolute 0.2 0.0 - 0.5 K/uL   Basophils Relative 0 %   Basophils Absolute 0.0 0.0 - 0.1 K/uL   Immature Granulocytes 1 %   Abs Immature Granulocytes 0.12 (H) 0.00 - 0.07 K/uL     Comment: Performed at [facility], [address] [place], [place] [address]  Comprehensive metabolic panel     Status: Abnormal   Collection Time: 11/11/22  4:11 AM  Result Value Ref Range   Sodium 121 (L) 135 - 145 mmol/L   Potassium 4.3 3.5 - 5.1 mmol/L   Chloride 90 (L) 98 - 111 mmol/L   CO2 21 (L) 22 - 32 mmol/L   Glucose, Bld 123 (H) 70 - 99 mg/dL    Comment: Glucose reference range applies only to samples taken after fasting for at least 8 hours.   BUN 30 (H) 8 - 23 mg/dL   Creatinine, Ser 4.69 (H) 0.44 - 1.00 mg/dL   Calcium 8.6 (L) 8.9 - 10.3 mg/dL   Total Protein 7.1 6.5 - 8.1 g/dL   Albumin 3.7 3.5 - 5.0 g/dL   AST 95 (H) 15 - 41 U/L   ALT 124 (H) 0 - 44 U/L   Alkaline Phosphatase 239 (H) 38 - 126 U/L   Total Bilirubin 3.3 (H) <1.2 mg/dL   GFR, Estimated 27 (L) >60 mL/min    Comment: (NOTE) Calculated using the CKD-EPI Creatinine Equation (2021)    Anion gap 10 5 - 15    Comment: Performed at [facility], [address] [place], [place] [address]  Lipase, blood     Status: None   Collection Time: 11/11/22  4:11 AM  Result Value Ref Range   Lipase 19 11 - 51 U/L    Comment: Performed at [facility], [address] [place], [place] [address]  Procalcitonin     Status: None   Collection Time: 11/11/22  5:53 AM  Result Value Ref Range   Procalcitonin 1.01 ng/mL    Comment:        Interpretation: PCT > 0.5 ng/mL and <= 2 ng/mL: Systemic infection (sepsis) is possible, but other conditions are known to elevate PCT as well. (NOTE)       Sepsis PCT Algorithm           Lower Respiratory Tract  Infection PCT Algorithm    ----------------------------     ----------------------------         PCT < 0.25 ng/mL                PCT < 0.10 ng/mL          Strongly encourage             Strongly discourage   discontinuation of antibiotics    initiation of antibiotics    ----------------------------      -----------------------------       PCT 0.25 - 0.50 ng/mL            PCT 0.10 - 0.25 ng/mL               OR       >80% decrease in PCT            Discourage initiation of                                            antibiotics      Encourage discontinuation           of antibiotics    ----------------------------     -----------------------------         PCT >= 0.50 ng/mL              PCT 0.26 - 0.50 ng/mL                AND       <80% decrease in PCT             Encourage initiation of                                             antibiotics       Encourage continuation           of antibiotics    ----------------------------     -----------------------------        PCT >= 0.50 ng/mL                  PCT > 0.50 ng/mL               AND         increase in PCT                  Strongly encourage                                      initiation of antibiotics    Strongly encourage escalation           of antibiotics                                     -----------------------------                                           PCT <= 0.25 ng/mL  OR                                        > 80% decrease in PCT                                      Discontinue / Do not initiate                                             antibiotics  Performed at [facility], [address] [place], [place] [address]   Lactic acid, plasma     Status: Abnormal   Collection Time: 11/11/22  5:53 AM  Result Value Ref Range   Lactic Acid, Venous 2.9 (HH) 0.5 - 1.9 mmol/L    Comment: CRITICAL RESULT CALLED TO, READ BACK BY AND VERIFIED WITH RICHARD EDWARDS 11/11/2022 AT [id number] SRR Performed at [facility], [address] [place], [place] [address]   Urinalysis, Routine w reflex microscopic -Urine, Clean Catch     Status: Abnormal   Collection Time: 11/11/22  7:00 AM  Result Value Ref Range   Color, Urine YELLOW (A) YELLOW   APPearance HAZY  (A) CLEAR   Specific Gravity, Urine 1.009 1.005 - 1.030   pH 5.0 5.0 - 8.0   Glucose, UA NEGATIVE NEGATIVE mg/dL   Hgb urine dipstick NEGATIVE NEGATIVE   Bilirubin Urine NEGATIVE NEGATIVE   Ketones, ur 5 (A) NEGATIVE mg/dL   Protein, ur 30 (A) NEGATIVE mg/dL   Nitrite NEGATIVE NEGATIVE   Leukocytes,Ua NEGATIVE NEGATIVE   RBC / HPF 0-5 0 - 5 RBC/hpf   WBC, UA 6-10 0 - 5 WBC/hpf   Bacteria, UA RARE (A) NONE SEEN   Squamous Epithelial / HPF 0-5 0 - 5 /HPF   Mucus PRESENT    Hyaline Casts, UA PRESENT    Granular Casts, UA PRESENT    WBC Casts, UA PRESENT    Non Squamous Epithelial PRESENT (A) NONE SEEN    Comment: Performed at [facility], [address] [place], [place] [address]   US ABDOMEN LIMITED RUQ (LIVER/GB)  Result Date: 11/11/2022 CLINICAL DATA:  Transaminitis. EXAM: ULTRASOUND ABDOMEN LIMITED RIGHT UPPER QUADRANT COMPARISON:  CT scan performed earlier same day. FINDINGS: Gallbladder: Surgically absent Common bile duct: Diameter: 4 mm Liver: Diffuse coarsening of hepatic echotexture with heterogeneous appearance. No discrete or focal mass lesion evident. Portal vein is patent on color Doppler imaging with normal direction of blood flow towards the liver. Other: None. IMPRESSION: 1. Diffuse coarsening of hepatic echotexture with heterogeneous appearance. Findings compatible with hepatic steatosis. 2. No biliary dilatation. 3. Status post cholecystectomy. Electronically Signed   By: Kennith Center M.D.   On: 11/11/2022 06:31   CT ABDOMEN PELVIS WO CONTRAST  Result Date: 11/11/2022 CLINICAL DATA:  Abdominal bloating with nausea vomiting and diarrhea. Transaminitis. EXAM: CT ABDOMEN AND PELVIS WITHOUT CONTRAST TECHNIQUE: Multidetector CT imaging of the abdomen and pelvis was performed following the standard protocol without IV contrast. RADIATION DOSE REDUCTION: This exam was performed according to the departmental dose-optimization program which includes automated exposure  control, adjustment of the mA and/or kV according to patient size and/or use of iterative reconstruction technique.  COMPARISON:  05/26/2019 FINDINGS: Lower chest: Atelectasis noted right middle lobe and lingula. No substantial pleural effusion. Hepatobiliary: No suspicious focal abnormality in the liver on this study without intravenous contrast. Cholecystectomy. No intrahepatic or extrahepatic biliary dilation. Pancreas: No focal mass lesion. No dilatation of the main duct. No intraparenchymal cyst. No peripancreatic edema. Spleen: No splenomegaly. No suspicious focal mass lesion. Adrenals/Urinary Tract: No adrenal nodule or mass. Central sinus cysts in both kidneys better demonstrated on previous exam performed with intravenous contrast material. Kidneys otherwise unremarkable. No evidence for hydroureter. The urinary bladder appears normal for the degree of distention. Stomach/Bowel: Stomach is unremarkable. No gastric wall thickening. No evidence of outlet obstruction. Duodenum is normally positioned as is the ligament of Treitz. No small bowel wall thickening. No small bowel dilatation. The terminal ileum is normal. The appendix is not well visualized, but there is no edema or inflammation in the region of the cecal tip to suggest appendicitis. No gross colonic mass. No colonic wall thickening. Diverticuli are seen scattered along the entire length of the colon, most prominent in the sigmoid segment without CT findings of diverticulitis. Vascular/Lymphatic: There is moderate atherosclerotic calcification of the abdominal aorta without aneurysm. Upper normal lymph nodes are seen in the hepatoduodenal ligament. No retroperitoneal lymphadenopathy. No pelvic sidewall lymphadenopathy. Reproductive: Hysterectomy.  There is no adnexal mass. Other: No intraperitoneal free fluid. Musculoskeletal: No worrisome lytic or sclerotic osseous abnormality. Tiny supraumbilical midline ventral hernia contains only fat.  IMPRESSION: 1. No acute findings in the abdomen or pelvis. Specifically, no findings to explain the patient's history of abdominal bloating, nausea, vomiting, and diarrhea. 2. Colonic diverticulosis without diverticulitis. 3.  Aortic Atherosclerosis (ICD10-I70.0). Electronically Signed   By: Kennith Center M.D.   On: 11/11/2022 06:25    Pending Labs Unresulted Labs (From admission, onward)     Start     Ordered   11/11/22 0509  Blood culture (routine x 2)  BLOOD CULTURE X 2,   STAT      11/11/22 0508   11/11/22 0509  Lactic acid, plasma  (Lactic Acid)  Now then every 2 hours,   STAT      11/11/22 0508   11/11/22 0509  Hepatitis panel, acute  Once,   URGENT        11/11/22 0508            Vitals/Pain Today's Vitals   11/11/22 0415 11/11/22 0425 11/11/22 0500 11/11/22 0740  BP: (!) 138/59  (!) 119/98 131/83  Pulse: 94  (!) 102 87  Resp: 13  18 (!) 21  Temp:  98.1 F (36.7 C)  98.3 F (36.8 C)  TempSrc:  Oral  Oral  SpO2: 97%  96% 96%  Weight:      Height:      PainSc:    0-No pain    Isolation Precautions No active isolations  Medications Medications  EPINEPHrine (EPI-PEN) injection 0.3 mg (0.3 mg Intramuscular Given 11/11/22 0422)  lactated ringers bolus 1,000 mL (0 mLs Intravenous Stopped 11/11/22 0532)  dexamethasone (DECADRON) injection 6 mg (6 mg Intravenous Given 11/11/22 0421)    Mobility walks with person assist     Focused Assessments Cardiac Assessment Handoff:  Cardiac Rhythm: Normal sinus rhythm No results found for: "CKTOTAL", "CKMB", "CKMBINDEX", "TROPONINI" No results found for: "DDIMER" Does the Patient currently have chest pain? No    R Recommendations: See Admitting Provider Note  Report given to:   Additional Notes:

## 2022-11-11 NOTE — Assessment & Plan Note (Signed)
Patient reporting generalized malaise, new onset central rash, body aches itching headache, nausea and decreased p.o. 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

## 2022-11-11 NOTE — ED Provider Notes (Signed)
[facility] Provider Note    Event Date/Time   First MD Initiated Contact with Patient 11/11/22 [phone]     (approximate)   History   Allergic Reaction   HPI  Nichole Sanchez is a 82 y.o. female who presents to the ED for evaluation of Allergic Reaction   Review of cardiology clinic visit from 9/19.  CAD with stenting in 2019, HTN, HLD, obesity.  I did a multitude of allergies, 23 documented allergies.  10 days ago, 10/31, patient had an elective outpatient trigger finger release with orthopedics and was subsequently started on Bactrim for 10 days for infectious prophylaxis.  Patient presents to the ED for evaluation of possible allergic reaction.  She reports doing well for 1 week after the procedure until Wednesday of this past week, about 4 days ago, she developed an upset stomach.  Then, over the past 2 days she developed a rash on her abdomen, she had a couple episodes of emesis without abdominal pain and feeling like she has a dry throat.  She was having trouble sleeping tonight so she presents to the ED with these symptoms.  She took 50 mg of Benadryl a couple hours prior to arrival.  Fever up to 102*F starting Thursday.   Physical Exam   Triage Vital Signs: ED Triage Vitals  Encounter Vitals Group     BP 11/11/22 0404 (!) 142/67     Systolic BP Percentile --      Diastolic BP Percentile --      Pulse Rate 11/11/22 0404 (!) 112     Resp 11/11/22 0404 (!) 22     Temp --      Temp src --      SpO2 11/11/22 0404 97 %     Weight 11/11/22 0403 179 lb (81.2 kg)     Height 11/11/22 0403 5\' 2"  (1.575 m)     Head Circumference --      Peak Flow --      Pain Score --      Pain Loc --      Pain Education --      Exclude from Growth Chart --     Most recent vital signs: Vitals:   11/11/22 0425 11/11/22 0500  BP:  (!) 119/98  Pulse:  (!) 102  Resp:  18  Temp: 98.1 F (36.7 C)   SpO2:  96%    General: Awake, no distress.  Pleasant and  conversational.  Dry mucous membranes on arrival. CV:  Good peripheral perfusion.  Resp:  Normal effort.  Abd:  No distention.  Soft and benign throughout.  No tenderness. MSK:  No deformity noted.  Neuro:  No focal deficits appreciated. Other:  Flat erythematous macular rash throughout much of the body that does not seem to involve the palms and soles or mucous membranes.      ED Results / Procedures / Treatments   Labs (all labs ordered are listed, but only abnormal results are displayed) Labs Reviewed  CBC WITH DIFFERENTIAL/PLATELET - Abnormal; Notable for the following components:      Result Value   WBC 11.4 (*)    MCHC 36.2 (*)    Platelets 121 (*)    Neutro Abs 10.2 (*)    Lymphs Abs 0.5 (*)    Abs Immature Granulocytes 0.12 (*)    All other components within normal limits  COMPREHENSIVE METABOLIC PANEL - Abnormal; Notable for the following components:   Sodium 121 (*)  Chloride 90 (*)    CO2 21 (*)    Glucose, Bld 123 (*)    BUN 30 (*)    Creatinine, Ser 1.84 (*)    Calcium 8.6 (*)    AST 95 (*)    ALT 124 (*)    Alkaline Phosphatase 239 (*)    Total Bilirubin 3.3 (*)    GFR, Estimated 27 (*)    All other components within normal limits  URINALYSIS, ROUTINE W REFLEX MICROSCOPIC - Abnormal; Notable for the following components:   Color, Urine YELLOW (*)    APPearance HAZY (*)    Ketones, ur 5 (*)    Protein, ur 30 (*)    Bacteria, UA RARE (*)    Non Squamous Epithelial PRESENT (*)    All other components within normal limits  LACTIC ACID, PLASMA - Abnormal; Notable for the following components:   Lactic Acid, Venous 2.9 (*)    All other components within normal limits  CULTURE, BLOOD (ROUTINE X 2)  CULTURE, BLOOD (ROUTINE X 2)  LIPASE, BLOOD  PROCALCITONIN  LACTIC ACID, PLASMA  HEPATITIS PANEL, ACUTE    EKG   RADIOLOGY Right upper quadrant ultrasound interpreted by me without evidence of pathology. CT abdomen/pelvis interpreted by me without  evidence of acute pathology.  Official radiology report(s): US ABDOMEN LIMITED RUQ (LIVER/GB)  Result Date: 11/11/2022 CLINICAL DATA:  Transaminitis. EXAM: ULTRASOUND ABDOMEN LIMITED RIGHT UPPER QUADRANT COMPARISON:  CT scan performed earlier same day. FINDINGS: Gallbladder: Surgically absent Common bile duct: Diameter: 4 mm Liver: Diffuse coarsening of hepatic echotexture with heterogeneous appearance. No discrete or focal mass lesion evident. Portal vein is patent on color Doppler imaging with normal direction of blood flow towards the liver. Other: None. IMPRESSION: 1. Diffuse coarsening of hepatic echotexture with heterogeneous appearance. Findings compatible with hepatic steatosis. 2. No biliary dilatation. 3. Status post cholecystectomy. Electronically Signed   By: Kennith Center M.D.   On: 11/11/2022 06:31   CT ABDOMEN PELVIS WO CONTRAST  Result Date: 11/11/2022 CLINICAL DATA:  Abdominal bloating with nausea vomiting and diarrhea. Transaminitis. EXAM: CT ABDOMEN AND PELVIS WITHOUT CONTRAST TECHNIQUE: Multidetector CT imaging of the abdomen and pelvis was performed following the standard protocol without IV contrast. RADIATION DOSE REDUCTION: This exam was performed according to the departmental dose-optimization program which includes automated exposure control, adjustment of the mA and/or kV according to patient size and/or use of iterative reconstruction technique. COMPARISON:  05/26/2019 FINDINGS: Lower chest: Atelectasis noted right middle lobe and lingula. No substantial pleural effusion. Hepatobiliary: No suspicious focal abnormality in the liver on this study without intravenous contrast. Cholecystectomy. No intrahepatic or extrahepatic biliary dilation. Pancreas: No focal mass lesion. No dilatation of the main duct. No intraparenchymal cyst. No peripancreatic edema. Spleen: No splenomegaly. No suspicious focal mass lesion. Adrenals/Urinary Tract: No adrenal nodule or mass. Central sinus  cysts in both kidneys better demonstrated on previous exam performed with intravenous contrast material. Kidneys otherwise unremarkable. No evidence for hydroureter. The urinary bladder appears normal for the degree of distention. Stomach/Bowel: Stomach is unremarkable. No gastric wall thickening. No evidence of outlet obstruction. Duodenum is normally positioned as is the ligament of Treitz. No small bowel wall thickening. No small bowel dilatation. The terminal ileum is normal. The appendix is not well visualized, but there is no edema or inflammation in the region of the cecal tip to suggest appendicitis. No gross colonic mass. No colonic wall thickening. Diverticuli are seen scattered along the entire length of the colon, most  prominent in the sigmoid segment without CT findings of diverticulitis. Vascular/Lymphatic: There is moderate atherosclerotic calcification of the abdominal aorta without aneurysm. Upper normal lymph nodes are seen in the hepatoduodenal ligament. No retroperitoneal lymphadenopathy. No pelvic sidewall lymphadenopathy. Reproductive: Hysterectomy.  There is no adnexal mass. Other: No intraperitoneal free fluid. Musculoskeletal: No worrisome lytic or sclerotic osseous abnormality. Tiny supraumbilical midline ventral hernia contains only fat. IMPRESSION: 1. No acute findings in the abdomen or pelvis. Specifically, no findings to explain the patient's history of abdominal bloating, nausea, vomiting, and diarrhea. 2. Colonic diverticulosis without diverticulitis. 3.  Aortic Atherosclerosis (ICD10-I70.0). Electronically Signed   By: Kennith Center M.D.   On: 11/11/2022 06:25    PROCEDURES and INTERVENTIONS:  .Critical Care  Performed by: Delton Prairie, MD Authorized by: Delton Prairie, MD   Critical care provider statement:    Critical care time (minutes):  30   Critical care time was exclusive of:  Separately billable procedures and treating other patients   Critical care was necessary to  treat or prevent imminent or life-threatening deterioration of the following conditions:  Toxidrome   Critical care was time spent personally by me on the following activities:  Development of treatment plan with patient or surrogate, discussions with consultants, evaluation of patient's response to treatment, examination of patient, ordering and review of laboratory studies, ordering and review of radiographic studies, ordering and performing treatments and interventions, pulse oximetry, re-evaluation of patient's condition and review of old charts   Medications  EPINEPHrine (EPI-PEN) injection 0.3 mg (0.3 mg Intramuscular Given 11/11/22 0422)  lactated ringers bolus 1,000 mL (0 mLs Intravenous Stopped 11/11/22 0532)  dexamethasone (DECADRON) injection 6 mg (6 mg Intravenous Given 11/11/22 0421)     IMPRESSION / MDM / ASSESSMENT AND PLAN / ED COURSE  I reviewed the triage vital signs and the nursing notes.  Differential diagnosis includes, but is not limited to, anaphylaxis, DRESS or other drug reaction, acute hepatitis, choledocholithiasis  {Patient presents with symptoms of an acute illness or injury that is potentially life-threatening.  Patient presents to the ED with concerns for allergic reaction but I more concerned about a drug reaction or hepatitis.  We do empirically try an EpiPen on arrival after I discussed this with her but no significant improvement of her symptoms.  Rash is flat and not quite consistent with hives, as pictured above.  Blood work with generally reassuring CBC with a mild leukocytosis and lymphopenia is noted.  Metabolic derangements are noted with hyponatremia, AKI and transaminitis.  She has no abdominal pain and is s/p cholecystectomy, right upper quadrant ultrasound with steatotic features but no signs of hepatobiliary obstruction.  CT abdomen/pelvis without clear acute features.  We draw cultures but do not provide antibiotics as I have no clear source.  She was  provided steroids with IV Decadron.  Consult medicine for further workup and admission  Clinical Course as of 11/11/22 0740  Sun Nov 11, 2022  4696 reassessed [DS]  0448 reassessed [DS]  0518 Reassessed and discussed serum abnormalities and possible etiologies of her symptoms.  Discussed my concern regarding her LFTs, kidney function and hyponatremia.  Discussed imaging, additional blood work and my recommendation for admission.  She is agreeable. [DS]  [phone] Reassessed and discussed imaging results and possible etiologies of her symptoms.  Some uncertainty.  Discussed admission and she is agreeable [DS]    Clinical Course User Index [DS] Delton Prairie, MD     FINAL CLINICAL IMPRESSION(S) / ED DIAGNOSES  Final diagnoses:  Adverse effect of drug, initial encounter  AKI (acute kidney injury) (HCC)  Hyponatremia     Rx / DC Orders   ED Discharge Orders     None        Note:  This document was prepared using Dragon voice recognition software and may include unintentional dictation errors.   Delton Prairie, MD 11/11/22 [phone]

## 2022-11-11 NOTE — Assessment & Plan Note (Signed)
Sodium 135 upon discharge.  Likely secondary to Bactrim and not eating.

## 2022-11-11 NOTE — ED Notes (Signed)
Admitting provider, Dr. Alvester Morin at patient bedside.

## 2022-11-11 NOTE — ED Triage Notes (Signed)
Pt arrived POV for allergic reaction. Pt started Bactrim a few days ago (recent hand sx).  Pt reports yesterday had vomiting/dry heaves, last night developed a rash to chest, itching, and now PTA pt feels SOB, expiratory wheezing noted. Pt reports throat tightness, tongue feels "raw and has bumps". But pt is able to speak in complete sentences, sats 97% on RA.   Pt took benadryl prior to midnight.

## 2022-11-11 NOTE — Assessment & Plan Note (Signed)
Elevated liver function test secondary to Bactrim use.  Right upper quadrant ultrasound shows hepatic steatosis and CT scan of the abdomen pelvis was negative.  Upon discharge total bilirubin down to 0.8, alkaline phosphatase 226 AST 62 and ALT 108

## 2022-11-11 NOTE — ED Notes (Signed)
Patient's belongings (shoes, ziploc bag with snacks, and purse) placed in patient's belongings bag.

## 2022-11-11 NOTE — Plan of Care (Signed)

## 2022-11-12 ENCOUNTER — Other Ambulatory Visit: Payer: Self-pay

## 2022-11-12 DIAGNOSIS — N179 Acute kidney failure, unspecified: Secondary | ICD-10-CM | POA: Diagnosis not present

## 2022-11-12 DIAGNOSIS — R7401 Elevation of levels of liver transaminase levels: Secondary | ICD-10-CM

## 2022-11-12 DIAGNOSIS — E871 Hypo-osmolality and hyponatremia: Secondary | ICD-10-CM | POA: Diagnosis not present

## 2022-11-12 DIAGNOSIS — L2989 Other pruritus: Secondary | ICD-10-CM | POA: Diagnosis not present

## 2022-11-12 DIAGNOSIS — R6510 Systemic inflammatory response syndrome (SIRS) of non-infectious origin without acute organ dysfunction: Secondary | ICD-10-CM

## 2022-11-12 DIAGNOSIS — I2510 Atherosclerotic heart disease of native coronary artery without angina pectoris: Secondary | ICD-10-CM

## 2022-11-12 DIAGNOSIS — T50905S Adverse effect of unspecified drugs, medicaments and biological substances, sequela: Secondary | ICD-10-CM

## 2022-11-12 DIAGNOSIS — T7840XA Allergy, unspecified, initial encounter: Secondary | ICD-10-CM | POA: Diagnosis present

## 2022-11-12 DIAGNOSIS — I10 Essential (primary) hypertension: Secondary | ICD-10-CM

## 2022-11-12 LAB — COMPREHENSIVE METABOLIC PANEL
ALT: 108 U/L — ABNORMAL HIGH (ref 0–44)
AST: 62 U/L — ABNORMAL HIGH (ref 15–41)
Albumin: 3.1 g/dL — ABNORMAL LOW (ref 3.5–5.0)
Alkaline Phosphatase: 226 U/L — ABNORMAL HIGH (ref 38–126)
Anion gap: 6 (ref 5–15)
BUN: 26 mg/dL — ABNORMAL HIGH (ref 8–23)
CO2: 21 mmol/L — ABNORMAL LOW (ref 22–32)
Calcium: 9.2 mg/dL (ref 8.9–10.3)
Chloride: 108 mmol/L (ref 98–111)
Creatinine, Ser: 1 mg/dL (ref 0.44–1.00)
GFR, Estimated: 57 mL/min — ABNORMAL LOW (ref >60)
Glucose, Bld: 125 mg/dL — ABNORMAL HIGH (ref 70–99)
Potassium: 4.8 mmol/L (ref 3.5–5.1)
Sodium: 135 mmol/L (ref 135–145)
Total Bilirubin: 0.8 mg/dL (ref <1.2)
Total Protein: 6 g/dL — ABNORMAL LOW (ref 6.5–8.1)

## 2022-11-12 LAB — CBC
HCT: 32.4 % — ABNORMAL LOW (ref 36.0–46.0)
Hemoglobin: 11.9 g/dL — ABNORMAL LOW (ref 12.0–15.0)
MCH: 31.3 pg (ref 26.0–34.0)
MCHC: 36.7 g/dL — ABNORMAL HIGH (ref 30.0–36.0)
MCV: 85.3 fL (ref 80.0–100.0)
Platelets: 177 10*3/uL (ref 150–400)
RBC: 3.8 MIL/uL — ABNORMAL LOW (ref 3.87–5.11)
RDW: 12.4 % (ref 11.5–15.5)
WBC: 8.6 10*3/uL (ref 4.0–10.5)
nRBC: 0 % (ref 0.0–0.2)

## 2022-11-12 LAB — SODIUM: Sodium: 130 mmol/L — ABNORMAL LOW (ref 135–145)

## 2022-11-12 MED ORDER — HYDROXYZINE HCL 10 MG PO TABS: 10.0000 mg | ORAL_TABLET | Freq: Three times a day (TID) | ORAL | 0 refills | Status: AC | PRN

## 2022-11-12 MED ORDER — DEXAMETHASONE 2 MG PO TABS: ORAL_TABLET | ORAL | 0 refills | Status: AC

## 2022-11-12 MED ORDER — ASPIRIN 81 MG PO TBEC
81.0000 mg | DELAYED_RELEASE_TABLET | Freq: Every day | ORAL | Status: DC
  Administered 2022-11-12: 81 mg via ORAL
  Filled 2022-11-12: qty 1

## 2022-11-12 MED ORDER — AMLODIPINE BESYLATE 5 MG PO TABS: 2.5000 mg | ORAL_TABLET | Freq: Every day | ORAL | Status: DC

## 2022-11-12 MED ORDER — IRBESARTAN 150 MG PO TABS
300.0000 mg | ORAL_TABLET | Freq: Every day | ORAL | Status: DC
  Administered 2022-11-12: 300 mg via ORAL
  Filled 2022-11-12: qty 2

## 2022-11-12 MED ORDER — ROSUVASTATIN CALCIUM 5 MG PO TABS: 5.0000 mg | ORAL_TABLET | Freq: Every day | ORAL | Status: DC

## 2022-11-12 MED ORDER — ACETAMINOPHEN 500 MG PO TABS: 1000.0000 mg | ORAL_TABLET | Freq: Four times a day (QID) | ORAL | Status: DC | PRN

## 2022-11-12 MED ORDER — LEVOTHYROXINE SODIUM 88 MCG PO TABS
88.0000 ug | ORAL_TABLET | Freq: Every day | ORAL | Status: DC
  Administered 2022-11-12: 88 ug via ORAL
  Filled 2022-11-12: qty 1

## 2022-11-12 MED ORDER — METHOCARBAMOL 500 MG PO TABS: 1000.0000 mg | ORAL_TABLET | Freq: Every day | ORAL | Status: DC | PRN

## 2022-11-12 MED ORDER — LORATADINE 10 MG PO TABS
10.0000 mg | ORAL_TABLET | Freq: Every morning | ORAL | Status: DC
  Administered 2022-11-12: 10 mg via ORAL
  Filled 2022-11-12: qty 1

## 2022-11-12 NOTE — Discharge Summary (Signed)
Physician Discharge Summary   Patient: Nichole Sanchez MRN: [id number] DOB: November 08, 1940  Admit date:     11/11/2022  Discharge date: 11/12/22  Discharge Physician: Alford Highland   PCP: Barbette Reichmann, MD   Recommendations at discharge:   Follow-up PCP 1 week Keep appointment to remove sutures tomorrow  Discharge Diagnoses: Principal Problem:   Drug reaction Active Problems:   Transaminitis   Hyponatremia   AKI (acute kidney injury) (HCC)   CAD (coronary artery disease)   HTN (hypertension)   SIRS (systemic inflammatory response syndrome) (HCC)   Allergic reaction   Allergic reaction to drug    Hospital Course: 82 year old female past medical history of CAD, heart failure with preserved ejection fraction, GERD, hypertension, hyperlipidemia, hypothyroidism.  She had a trigger finger release done on 10/31 and was placed on prophylactic Bactrim for 10 days.  She was having malaise, nausea vomiting decreased oral intake headaches, fever and body rash with itching.  She was admitted to the hospital and started on steroid and itching medication.  11/11.  Patient feeling better.  Able to eat.  Still has a rash and some itching.  Will discharge home on Decadron taper and Atarax.  Discontinue Bactrim.  Assessment and Plan: * Drug reaction Drug reaction to Bactrim with rash, nausea vomiting, acute kidney injury hyponatremia and liver function test elevation.  Bactrim listed as an allergy.  Will give Decadron taper and as needed Atarax.  Transaminitis Elevated liver function test secondary to Bactrim use.  Right upper quadrant ultrasound shows hepatic steatosis and CT scan of the abdomen pelvis was negative.  Upon discharge total bilirubin down to 0.8, alkaline phosphatase 226 AST 62 and ALT 108  Hyponatremia Sodium 135 upon discharge.  Likely secondary to Bactrim and not eating.  AKI (acute kidney injury) (HCC) Creatinine 1.84 upon admission at 1.0 upon discharge.  Likely  secondary to Bactrim and not eating well.   SIRS (systemic inflammatory response syndrome) (HCC) No signs of infection.  Hold off on antibiotics.  Likely reaction secondary to Bactrim.  HTN (hypertension) Can go back on blood pressure medication  CAD (coronary artery disease) Continue home regimen including aspirin and statin          Consultants: None Procedures performed: None Disposition: Home Diet recommendation:  Cardiac diet DISCHARGE MEDICATION: Allergies as of 11/12/2022       Reactions   Bactrim [sulfamethoxazole-trimethoprim] Rash   Diffuse Rash on chest and back, elevated LFTs   Propylene Glycol Rash, Other (See Comments)   blisters   Sulfa Antibiotics Rash   Tricor [fenofibrate] Shortness Of Breath   Increases blood pressure   Tussin Cough [dextromethorphan Hbr] Rash, Other (See Comments)   Propylene glychol Increases blood pressure   Advair Diskus [fluticasone-salmeterol] Other (See Comments)   blisters   Albuterol Sulfate Swelling   Benzalkonium Chloride Other (See Comments)   Unknown   Cyclobenzaprine Other (See Comments)   Unknown   Hctz [hydrochlorothiazide] Other (See Comments)   blisters   Neosporin [neomycin-bacitracin Zn-polymyx] Other (See Comments)   blisters   Nickel Other (See Comments)   Itching, blister and raw rash   Prednisone Other (See Comments)   Can not take in large doses   Proair Hfa [albuterol] Swelling   Codeine Nausea And Vomiting, Rash   Esomeprazole Rash, Other (See Comments)   Writing on pill has propylene glychol   Etodolac Palpitations   Latex Rash   Naproxen Palpitations   Nystatin Rash   Oxycodone-acetaminophen Rash   Patient  reports that she can take tylenol   Polysporin [bacitracin-polymyxin B] Rash   Tape Rash   Dermabond- Blisters   Tussionex Pennkinetic Er [hydrocod Poli-chlorphe Poli Er] Rash        Medication List     STOP taking these medications    meloxicam 15 MG tablet Commonly known  as: MOBIC       TAKE these medications    acetaminophen 500 MG tablet Commonly known as: TYLENOL Take 1,000 mg by mouth every 6 (six) hours as needed for moderate pain or headache.   alum hydroxide-mag trisilicate 80-20 MG Chew chewable tablet Commonly known as: GAVISCON Chew 1 tablet by mouth as needed for indigestion or heartburn.   amLODipine 2.5 MG tablet Commonly known as: NORVASC Take 2.5 mg by mouth at bedtime.   aspirin EC 81 MG tablet Take 81 mg by mouth daily. Swallow whole.   AZO-CRANBERRY PO Take 2 capsules by mouth daily.   BIOTIN PO Take 1 capsule by mouth daily.   candesartan 32 MG tablet Commonly known as: ATACAND Take 32 mg by mouth daily.   dexamethasone 2 MG tablet Commonly known as: DECADRON Two tabs po daily for two days then 1 tab po daily for two days then 1/2 tab po daily for two days Start taking on: November 13, 2022   Fish Oil 1000 MG Caps Take 1,000 mg by mouth 2 (two) times daily.   hydrOXYzine 10 MG tablet Commonly known as: ATARAX Take 1 tablet (10 mg total) by mouth every 8 (eight) hours as needed for itching.   levothyroxine 88 MCG tablet Commonly known as: SYNTHROID Take 88 mcg by mouth daily before breakfast.   loratadine 10 MG tablet Commonly known as: CLARITIN Take 10 mg by mouth in the morning.   meclizine 25 MG tablet Commonly known as: ANTIVERT Take 25 mg by mouth 3 (three) times daily as needed for dizziness.   methocarbamol 500 MG tablet Commonly known as: ROBAXIN Take 1,000 mg by mouth daily as needed (Back pain).   rosuvastatin 5 MG tablet Commonly known as: CRESTOR Take 5 mg by mouth daily.   SUPER B COMPLEX PO Take 1 tablet by mouth daily with lunch.   Vitamin D 50 MCG (2000 UT) Caps Take 2,000 Units by mouth daily.   vitamin E 180 MG (400 UNITS) capsule Take 400 Units by mouth daily.        Discharge Exam: Filed Weights   11/11/22 0403  Weight: 81.2 kg   Physical Exam HENT:     Head:  Normocephalic.     Mouth/Throat:     Pharynx: No oropharyngeal exudate.  Eyes:     General: Lids are normal.     Conjunctiva/sclera: Conjunctivae normal.  Cardiovascular:     Rate and Rhythm: Normal rate and regular rhythm.     Heart sounds: Normal heart sounds, S1 normal and S2 normal.  Pulmonary:     Breath sounds: No decreased breath sounds, wheezing, rhonchi or rales.  Abdominal:     Palpations: Abdomen is soft.     Tenderness: There is no abdominal tenderness.  Musculoskeletal:     Right lower leg: No swelling.     Left lower leg: No swelling.  Skin:    Comments: Diffuse maculopapular rash on chest abdomen and back, a little bit on the arms.  Neurological:     Mental Status: She is alert and oriented to person, place, and time.      Condition at discharge: stable  The results of significant diagnostics from this hospitalization (including imaging, microbiology, ancillary and laboratory) are listed below for reference.   Imaging Studies: US ABDOMEN LIMITED RUQ (LIVER/GB)  Result Date: 11/11/2022 CLINICAL DATA:  Transaminitis. EXAM: ULTRASOUND ABDOMEN LIMITED RIGHT UPPER QUADRANT COMPARISON:  CT scan performed earlier same day. FINDINGS: Gallbladder: Surgically absent Common bile duct: Diameter: 4 mm Liver: Diffuse coarsening of hepatic echotexture with heterogeneous appearance. No discrete or focal mass lesion evident. Portal vein is patent on color Doppler imaging with normal direction of blood flow towards the liver. Other: None. IMPRESSION: 1. Diffuse coarsening of hepatic echotexture with heterogeneous appearance. Findings compatible with hepatic steatosis. 2. No biliary dilatation. 3. Status post cholecystectomy. Electronically Signed   By: Kennith Center M.D.   On: 11/11/2022 06:31   CT ABDOMEN PELVIS WO CONTRAST  Result Date: 11/11/2022 CLINICAL DATA:  Abdominal bloating with nausea vomiting and diarrhea. Transaminitis. EXAM: CT ABDOMEN AND PELVIS WITHOUT CONTRAST  TECHNIQUE: Multidetector CT imaging of the abdomen and pelvis was performed following the standard protocol without IV contrast. RADIATION DOSE REDUCTION: This exam was performed according to the departmental dose-optimization program which includes automated exposure control, adjustment of the mA and/or kV according to patient size and/or use of iterative reconstruction technique. COMPARISON:  05/26/2019 FINDINGS: Lower chest: Atelectasis noted right middle lobe and lingula. No substantial pleural effusion. Hepatobiliary: No suspicious focal abnormality in the liver on this study without intravenous contrast. Cholecystectomy. No intrahepatic or extrahepatic biliary dilation. Pancreas: No focal mass lesion. No dilatation of the main duct. No intraparenchymal cyst. No peripancreatic edema. Spleen: No splenomegaly. No suspicious focal mass lesion. Adrenals/Urinary Tract: No adrenal nodule or mass. Central sinus cysts in both kidneys better demonstrated on previous exam performed with intravenous contrast material. Kidneys otherwise unremarkable. No evidence for hydroureter. The urinary bladder appears normal for the degree of distention. Stomach/Bowel: Stomach is unremarkable. No gastric wall thickening. No evidence of outlet obstruction. Duodenum is normally positioned as is the ligament of Treitz. No small bowel wall thickening. No small bowel dilatation. The terminal ileum is normal. The appendix is not well visualized, but there is no edema or inflammation in the region of the cecal tip to suggest appendicitis. No gross colonic mass. No colonic wall thickening. Diverticuli are seen scattered along the entire length of the colon, most prominent in the sigmoid segment without CT findings of diverticulitis. Vascular/Lymphatic: There is moderate atherosclerotic calcification of the abdominal aorta without aneurysm. Upper normal lymph nodes are seen in the hepatoduodenal ligament. No retroperitoneal lymphadenopathy. No  pelvic sidewall lymphadenopathy. Reproductive: Hysterectomy.  There is no adnexal mass. Other: No intraperitoneal free fluid. Musculoskeletal: No worrisome lytic or sclerotic osseous abnormality. Tiny supraumbilical midline ventral hernia contains only fat. IMPRESSION: 1. No acute findings in the abdomen or pelvis. Specifically, no findings to explain the patient's history of abdominal bloating, nausea, vomiting, and diarrhea. 2. Colonic diverticulosis without diverticulitis. 3.  Aortic Atherosclerosis (ICD10-I70.0). Electronically Signed   By: Kennith Center M.D.   On: 11/11/2022 06:25    Microbiology: Results for orders placed or performed during the hospital encounter of 09/22/21  Surgical pcr screen     Status: None   Collection Time: 09/22/21  9:39 AM   Specimen: Nasal Mucosa; Nasal Swab  Result Value Ref Range Status   MRSA, PCR NEGATIVE NEGATIVE Final   Staphylococcus aureus NEGATIVE NEGATIVE Final    Comment: (NOTE) The Xpert SA Assay (FDA approved for NASAL specimens in patients 2 years of age and older), is  one component of a comprehensive surveillance program. It is not intended to diagnose infection nor to guide or monitor treatment. Performed at [facility], [address] [place] [address]     Labs: CBC: Recent Labs  Lab 11/11/22 0411 11/12/22 0343  WBC 11.4* 8.6  NEUTROABS 10.2*  --   HGB 14.1 11.9*  HCT 39.0 32.4*  MCV 87.4 85.3  PLT 121* 177   Basic Metabolic Panel: Recent Labs  Lab 11/11/22 0411 11/11/22 0900 11/11/22 1303 11/11/22 1650 11/11/22 2053 11/12/22 0001 11/12/22 0343  NA 121*   < > 125* 126* 129* 130* 135  K 4.3  --   --   --  4.5  --  4.8  CL 90*  --   --   --  99  --  108  CO2 21*  --   --   --  22  --  21*  GLUCOSE 123*  --   --   --  161*  --  125*  BUN 30*  --   --   --  25*  --  26*  CREATININE 1.84*  --   --   --  1.35*  --  1.00  CALCIUM 8.6*  --   --   --  8.8*  --  9.2   < > = values in this interval not  displayed.   Liver Function Tests: Recent Labs  Lab 11/11/22 0411 11/11/22 1303 11/12/22 0343  AST 95*  --  62*  ALT 124*  --  108*  ALKPHOS 239*  --  226*  BILITOT 3.3* 1.6* 0.8  PROT 7.1  --  6.0*  ALBUMIN 3.7  --  3.1*   CBG: No results for input(s): "GLUCAP" in the last 168 hours.  Discharge time spent: greater than 30 minutes.  Signed: Alford Highland, MD [facility] 11/12/2022

## 2022-11-12 NOTE — Progress Notes (Signed)
Patient alert and oriented, vss, no complaints of pain.  D/c telemetry and piv.  Escorted out of hospital via wheelchair by volunteers.   

## 2022-11-12 NOTE — Hospital Course (Signed)
82 year old female past medical history of CAD, heart failure with preserved ejection fraction, GERD, hypertension, hyperlipidemia, hypothyroidism.  She had a trigger finger release done on 10/31 and was placed on prophylactic Bactrim for 10 days.  She was having malaise, nausea vomiting decreased oral intake headaches, fever and body rash with itching.  She was admitted to the hospital and started on steroid and itching medication.  11/11.  Patient feeling better.  Able to eat.  Still has a rash and some itching.  Will discharge home on Decadron taper and Atarax.  Discontinue Bactrim.

## 2022-11-12 NOTE — Plan of Care (Signed)

## 2022-11-12 NOTE — Progress Notes (Signed)
Notified MD patient with progressive level of care status and on a medical telemetry floor. Clarified if patient to be downgraded or maintain at current level. Per MD, pt to remain progressive. Charge and [facility] notified for bed request. Patient transferred to 2A after report, all belongings transported with patient including cell phone, purse, and hearing aids.

## 2022-11-16 LAB — CULTURE, BLOOD (ROUTINE X 2)
Culture: NO GROWTH
Culture: NO GROWTH
Special Requests: ADEQUATE

## 2023-05-23 DIAGNOSIS — R040 Epistaxis: Secondary | ICD-10-CM | POA: Diagnosis present

## 2023-05-23 DIAGNOSIS — Z5321 Procedure and treatment not carried out due to patient leaving prior to being seen by health care provider: Secondary | ICD-10-CM | POA: Insufficient documentation

## 2023-05-24 ENCOUNTER — Emergency Department
Admission: EM | Admit: 2023-05-24 | Discharge: 2023-05-24 | Discharge status: Against Medical Advice | Attending: Emergency Medicine | Admitting: Emergency Medicine

## 2023-05-24 ENCOUNTER — Other Ambulatory Visit: Payer: Self-pay

## 2023-05-24 NOTE — ED Triage Notes (Signed)
 Pt to ED via [facility] from home c/o nose bleed. Pt nose started bleeding around 9:30 pm. Pt has hx of nosebleeds and they usually stop with time. Pt has received 2 sprays of afrin to left nare by EMS. Nose is clamped.

## 2023-05-30 ENCOUNTER — Ambulatory Visit
Admission: RE | Admit: 2023-05-30 | Discharge: 2023-05-30 | Disposition: A | Discharge status: Home / Self Care | Source: Ambulatory Visit | Attending: Physician Assistant | Admitting: Physician Assistant

## 2023-05-30 ENCOUNTER — Other Ambulatory Visit: Payer: Self-pay | Admitting: Physician Assistant

## 2023-05-30 DIAGNOSIS — R040 Epistaxis: Secondary | ICD-10-CM | POA: Diagnosis present

## 2023-05-30 DIAGNOSIS — S0990XA Unspecified injury of head, initial encounter: Secondary | ICD-10-CM
